# Patient Record
Sex: Female | Born: 1995 | Race: Black or African American | Hispanic: No | Marital: Single | Smoking: Never smoker
Health system: Southern US, Community
[De-identification: ages and names within clinical notes are randomized; demographics above are authoritative.]

## PROBLEM LIST (undated history)

## (undated) DIAGNOSIS — D649 Anemia, unspecified: Secondary | ICD-10-CM

---
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
## 2017-06-10 ENCOUNTER — Emergency Department: Payer: Medicaid Other

## 2017-06-10 ENCOUNTER — Inpatient Hospital Stay
Admission: EM | Admit: 2017-06-10 | Discharge: 2017-06-12 | DRG: 812 | Disposition: A | Discharge status: Home / Self Care | Payer: Medicaid Other | Attending: Obstetrics & Gynecology | Admitting: Obstetrics & Gynecology

## 2017-06-10 DIAGNOSIS — N920 Excessive and frequent menstruation with regular cycle: Secondary | ICD-10-CM

## 2017-06-10 DIAGNOSIS — D62 Acute posthemorrhagic anemia: Principal | ICD-10-CM | POA: Diagnosis present

## 2017-06-10 DIAGNOSIS — D509 Iron deficiency anemia, unspecified: Secondary | ICD-10-CM | POA: Diagnosis present

## 2017-06-10 DIAGNOSIS — D500 Iron deficiency anemia secondary to blood loss (chronic): Secondary | ICD-10-CM

## 2017-06-10 DIAGNOSIS — Z68.41 Body mass index (BMI) pediatric, greater than or equal to 95th percentile for age: Secondary | ICD-10-CM

## 2017-06-10 DIAGNOSIS — D696 Thrombocytopenia, unspecified: Secondary | ICD-10-CM | POA: Diagnosis present

## 2017-06-10 DIAGNOSIS — E669 Obesity, unspecified: Secondary | ICD-10-CM | POA: Diagnosis present

## 2017-06-10 DIAGNOSIS — D649 Anemia, unspecified: Secondary | ICD-10-CM

## 2017-06-10 HISTORY — DX: Anemia, unspecified: D64.9

## 2017-06-10 LAB — URINALYSIS, ROUTINE W REFLEX MICROSCOPIC
BACTERIA UA: NONE SEEN
BILIRUBIN URINE: NEGATIVE
Glucose, UA: NEGATIVE mg/dL
Ketones, ur: 20 mg/dL — AB
LEUKOCYTES UA: NEGATIVE
Nitrite: NEGATIVE
PROTEIN: NEGATIVE mg/dL
SPECIFIC GRAVITY, URINE: 1.017 (ref 1.005–1.030)
pH: 6 (ref 5.0–8.0)

## 2017-06-10 LAB — CBC
HCT: 19.4 % — ABNORMAL LOW (ref 35.0–47.0)
HEMATOCRIT: 14.3 % — AB (ref 35.0–47.0)
HEMATOCRIT: 16.2 % — AB (ref 35.0–47.0)
HEMOGLOBIN: 4.1 g/dL — AB (ref 12.0–16.0)
HEMOGLOBIN: 4.9 g/dL — AB (ref 12.0–16.0)
Hemoglobin: 6.1 g/dL — ABNORMAL LOW (ref 12.0–16.0)
MCH: 16.8 pg — ABNORMAL LOW (ref 26.0–34.0)
MCH: 19.1 pg — ABNORMAL LOW (ref 26.0–34.0)
MCH: 21.2 pg — ABNORMAL LOW (ref 26.0–34.0)
MCHC: 29 g/dL — ABNORMAL LOW (ref 32.0–36.0)
MCHC: 30.1 g/dL — ABNORMAL LOW (ref 32.0–36.0)
MCHC: 31.3 g/dL — ABNORMAL LOW (ref 32.0–36.0)
MCV: 58 fL — ABNORMAL LOW (ref 80.0–100.0)
MCV: 63.6 fL — AB (ref 80.0–100.0)
MCV: 67.7 fL — ABNORMAL LOW (ref 80.0–100.0)
PLATELETS: 42 10*3/uL — AB (ref 150–440)
Platelets: 38 10*3/uL — ABNORMAL LOW (ref 150–440)
Platelets: 36 10*3/uL — ABNORMAL LOW (ref 150–440)
RBC: 2.47 MIL/uL — AB (ref 3.80–5.20)
RBC: 2.55 MIL/uL — ABNORMAL LOW (ref 3.80–5.20)
RBC: 2.87 MIL/uL — AB (ref 3.80–5.20)
RDW: 29.7 % — ABNORMAL HIGH (ref 11.5–14.5)
RDW: 34.3 % — ABNORMAL HIGH (ref 11.5–14.5)
RDW: 34.8 % — ABNORMAL HIGH (ref 11.5–14.5)
WBC: 8.5 10*3/uL (ref 3.6–11.0)
WBC: 8.8 10*3/uL (ref 3.6–11.0)
WBC: 11.5 10*3/uL — AB (ref 3.6–11.0)

## 2017-06-10 LAB — COMPREHENSIVE METABOLIC PANEL
ALBUMIN: 3.1 g/dL — AB (ref 3.5–5.0)
ALK PHOS: 39 U/L (ref 38–126)
ALT: 13 U/L — ABNORMAL LOW (ref 14–54)
AST: 20 U/L (ref 15–41)
Anion gap: 7 (ref 5–15)
BUN: 14 mg/dL (ref 6–20)
CALCIUM: 8.7 mg/dL — AB (ref 8.9–10.3)
CO2: 26 mmol/L (ref 22–32)
Chloride: 106 mmol/L (ref 101–111)
Creatinine, Ser: 0.59 mg/dL (ref 0.44–1.00)
GFR calc Af Amer: >60 mL/min (ref >60)
GFR calc non Af Amer: >60 mL/min (ref >60)
GLUCOSE: 117 mg/dL — AB (ref 65–99)
Potassium: 3.5 mmol/L (ref 3.5–5.1)
Sodium: 139 mmol/L (ref 135–145)
Total Bilirubin: 0.1 mg/dL — ABNORMAL LOW (ref 0.3–1.2)
Total Protein: 7.3 g/dL (ref 6.5–8.1)

## 2017-06-10 LAB — PROTIME-INR
INR: 1.09
Prothrombin Time: 14.1 seconds (ref 11.4–15.2)

## 2017-06-10 LAB — ABO/RH: ABO/RH(D): A POS

## 2017-06-10 LAB — HEMOGLOBIN A1C
HEMOGLOBIN A1C: 5.3 % (ref 4.8–5.6)
Mean Plasma Glucose: 105.41 mg/dL

## 2017-06-10 LAB — TROPONIN I: Troponin I: <0.03 ng/mL (ref <0.03)

## 2017-06-10 LAB — PREPARE RBC (CROSSMATCH)

## 2017-06-10 LAB — POCT PREGNANCY, URINE: PREG TEST UR: NEGATIVE

## 2017-06-10 LAB — APTT: aPTT: 25 seconds (ref 24–36)

## 2017-06-10 LAB — TSH: TSH: 1.442 u[IU]/mL (ref 0.350–4.500)

## 2017-06-10 LAB — FIBRINOGEN: Fibrinogen: 346 mg/dL (ref 210–475)

## 2017-06-10 MED ORDER — FUROSEMIDE 10 MG/ML IJ SOLN: 20.0000 mg | INTRAMUSCULAR | Status: DC | PRN

## 2017-06-10 MED ORDER — SODIUM CHLORIDE 0.9 % IV SOLN
10.0000 mL/h | Freq: Once | INTRAVENOUS | Status: AC
  Administered 2017-06-11: 10 mL/h via INTRAVENOUS

## 2017-06-10 MED ORDER — SODIUM CHLORIDE 0.9 % IV BOLUS (SEPSIS)
1000.0000 mL | Freq: Once | INTRAVENOUS | Status: AC
  Administered 2017-06-10: 1000 mL via INTRAVENOUS

## 2017-06-10 MED ORDER — SODIUM CHLORIDE 0.9 % IV SOLN
Freq: Once | INTRAVENOUS | Status: AC
  Administered 2017-06-10: 19:00:00 via INTRAVENOUS

## 2017-06-10 MED ORDER — PRENATAL MULTIVITAMIN CH
1.0000 | ORAL_TABLET | Freq: Every day | ORAL | Status: DC
  Filled 2017-06-10 (×2): qty 1

## 2017-06-10 NOTE — Progress Notes (Signed)
Received phone call from lab stating patients has a hemoglobin of 4.9.  Second transfusion to take place at this time.

## 2017-06-10 NOTE — ED Notes (Signed)
Date and time results received: 06/10/17 9:52 AM  Test: Hemoglobin and Hemotocrit Critical Value: Hgb 4.1, Hct 14.1  Name of Provider Notified: Dr. Lenard Lance  Orders Received? Or Actions Taken?: Orders Received - See Orders for details

## 2017-06-10 NOTE — ED Notes (Signed)
Apologized for delay to patient and family. Pt continues to be resting in bed. Will continue to monitor. Pt requesting something to drink. Explained will have to speak with MD about being able to eat and drink. Pt states understanding at this time.

## 2017-06-10 NOTE — ED Notes (Signed)
This RN attempted to call report to 3rd floor. Per 3rd floor, pt is not appropriate, will be contacting house supervisor to move patient to more appropriate floor.

## 2017-06-10 NOTE — ED Notes (Signed)
Pt continues to be resting in bed with family at bedside at this time. NAD noted at this time. Will continue to monitor for further patient needs.

## 2017-06-10 NOTE — ED Notes (Signed)
Shannin, RN at bedside to co-sign on blood transfusion.

## 2017-06-10 NOTE — ED Triage Notes (Signed)
Pt with mother who reports pt has been having N/V x 2 days with extreme fatigue. Pt denies pain. States that she was heading to school this am when she fell bc her legs felt weak, pt denies hitting head. Pt appears pale in color. Hx of anemia. Pt states that she had a period that began 4 days ago and has been much heavier than usual.

## 2017-06-10 NOTE — ED Notes (Signed)
Pt taken to Xray.

## 2017-06-10 NOTE — ED Triage Notes (Signed)
Pt c/o N/V for the past 2 days with fatigue.. States "I just feel so weak".Marland Kitchen

## 2017-06-10 NOTE — ED Notes (Signed)
Consent obtained and placed in the chart at this time. Pt up to the bathroom to attempt to give a UA. X-ray waiting to take patient to the bathroom at this time.

## 2017-06-10 NOTE — ED Notes (Addendum)
This RN called to Dr. Elesa Massed to let her know about delay. Per Dr. Elesa Massed, if not too long before pt is transferred then okay to hold other units and let patient get type specific blood on floor. Explained delay to patient at this time.

## 2017-06-10 NOTE — ED Notes (Signed)
Pt up to the bathroom with assistance from mom at this time. NAD noted. Apologized for delay. Will continue to monitor.

## 2017-06-10 NOTE — ED Notes (Signed)
MD notified of patient's in patient's HR from 113 to 100 and BP from 125/59 to 125/49. Upon this RN exiting the room pt is noted to be calm and resting in bed at this time watching TV with family at bedside. NAD noted, pt denies any CP, SHOB, dizziness, pt is alert and oriented, NAD noted at this time.

## 2017-06-10 NOTE — Consult Note (Signed)
Patient is an 21 year old female who reports heavy menses and presents to the emergency room with significant weakness and fatigue. Her hemoglobin was found to be 4.1 with an MCV of 58.0 and a platelet count of 38. Case discussed with emergency room physician.   I suspect this is severe iron deficiency anemia secondary to heavy menses and her significant thrombocytopenia is consumptive. Agree with blood transfusion. Patient may require bone marrow biopsy in the next 1-2 days if no significant improvement of her blood counts. Case was also discussed with pathology with no obvious blasts or other abnormalities other than her severe cytopenias on peripheral smear.  Full consult to follow.

## 2017-06-10 NOTE — Consult Note (Signed)
[place]  [phone] [phone]) [phone]  ID: Kara Bell OB: 31-Jan-1996  MR#: [id number]  GMW#:[id number]  Patient Care Team: Patient, No Pcp Per as PCP - General (General Practice)  CHIEF COMPLAINT: Severe symptomatic anemia, thrombocytopenia.  INTERVAL HISTORY: Patient is an 21 year old female who presented to the ER with several months complain of worsening weakness and fatigue. She also reports significantly menses. Upon evaluation, she is noted to have a hemoglobin of 4.1 and a platelet count of 38. She feels mildly improved after 1 unit of packed red blood cells improving her hemoglobin of 4.9. She has no neurologic complaints. She denies any recent fevers or illnesses. She has a good appetite and denies weight loss. She has no chest pain or shortness of breath. She denies any nausea, vomiting, constipation, or diarrhea. She has no melena or hematochezia. She has no urinary complaints. Patient otherwise feels well and offers no further specific complaints.  REVIEW OF SYSTEMS:   Review of Systems  Constitutional: Positive for malaise/fatigue. Negative for fever and weight loss.  Respiratory: Negative.  Negative for cough, hemoptysis and shortness of breath.   Cardiovascular: Negative.  Negative for chest pain and leg swelling.  Gastrointestinal: Negative.  Negative for abdominal pain, blood in stool and melena.  Genitourinary: Negative.  Negative for hematuria.  Musculoskeletal: Negative.   Skin: Negative.  Negative for rash.  Neurological: Positive for weakness.  Psychiatric/Behavioral: Negative.  The patient is not nervous/anxious.     As per HPI. Otherwise, a complete review of systems is negative.  PAST MEDICAL HISTORY: Past Medical History:  Diagnosis Date  . Anemia     PAST SURGICAL HISTORY: History reviewed. No pertinent surgical history.  FAMILY HISTORY:Reviewed and unchanged. No reported history of malignancy or chronic  disease.  ADVANCED DIRECTIVES (Y/N):  @ADVDIR @  HEALTH MAINTENANCE: Social History  Substance Use Topics  . Smoking status: Never Smoker  . Smokeless tobacco: Never Used  . Alcohol use No     Colonoscopy:  PAP:  Bone density:  Lipid panel:  No Known Allergies  Current Facility-Administered Medications  Medication Dose Route Frequency Provider Last Rate Last Dose  . 0.9 %  sodium chloride infusion  10 mL/hr Intravenous Once Harvest Dark, MD   Stopped at 06/10/17 1835  . furosemide (LASIX) injection 20 mg  20 mg Intravenous PRN Ward, Honor Loh, MD      . prenatal multivitamin tablet 1 tablet  1 tablet Oral Q1200 Ward, Honor Loh, MD        OBJECTIVE: Vitals:   06/10/17 2042 06/10/17 2301  BP: (!) 139/51 (!) 123/44  Pulse: 99 92  Resp: 20 20  Temp: 99 F (37.2 C) 98.9 F (37.2 C)  SpO2: 100% 100%     Body mass index is 41.2 kg/m.    ECOG FS:0 - Asymptomatic  General: Well-developed, well-nourished, no acute distress. Eyes: Pink conjunctiva, anicteric sclera. HEENT: Normocephalic, moist mucous membranes, clear oropharnyx. Lungs: Clear to auscultation bilaterally. Heart: Regular rate and rhythm. No rubs, murmurs, or gallops. Abdomen: Soft, nontender, nondistended. No organomegaly noted, normoactive bowel sounds. Musculoskeletal: No edema, cyanosis, or clubbing. Neuro: Alert, answering all questions appropriately. Cranial nerves grossly intact. Skin: No rashes or petechiae noted. Psych: Normal affect. Lymphatics: No cervical, calvicular, axillary or inguinal LAD.   LAB RESULTS:  Lab Results  Component Value Date   NA 139 06/10/2017   K 3.5 06/10/2017   CL 106 06/10/2017   CO2 26 06/10/2017   GLUCOSE 117 (H) 06/10/2017  BUN 14 06/10/2017   CREATININE 0.59 06/10/2017   CALCIUM 8.7 (L) 06/10/2017   PROT 7.3 06/10/2017   ALBUMIN 3.1 (L) 06/10/2017   AST 20 06/10/2017   ALT 13 (L) 06/10/2017   ALKPHOS 39 06/10/2017   BILITOT 0.1 (L) 06/10/2017    GFRNONAA >60 06/10/2017   GFRAA >60 06/10/2017    Lab Results  Component Value Date   WBC 11.5 (H) 06/10/2017   HGB 6.1 (L) 06/10/2017   HCT 19.4 (L) 06/10/2017   MCV 67.7 (L) 06/10/2017   PLT 42 (L) 06/10/2017     STUDIES: Dg Chest 2 View  Result Date: 06/10/2017 CLINICAL DATA:  Anemia. Dizziness x 2 days. Pt stated she fainted today and was told she has a low blood count today. Never smoker No sx EXAM: CHEST  2 VIEW COMPARISON:  None. FINDINGS: Heart size and mediastinal contours are within normal limits. Lungs are clear. Lung volumes are normal. No pleural effusion or pneumothorax. Mild dextroscoliosis of the mid/upper thoracic spine. No acute or suspicious osseous finding. IMPRESSION: No active cardiopulmonary disease. No evidence of pneumonia or pulmonary edema. Mild scoliosis of the thoracic spine, measuring approximately 6 degrees. Electronically Signed   By: Franki Cabot M.D.   On: 06/10/2017 11:45    ASSESSMENT: Severe symptomatic anemia, thrombocytopenia.  PLAN:    1. Symptomatic anemia: Likely secondary to heavy menses and severe iron deficiency. Patient's hemoglobin is improving with several units of packed red blood cells. Case was discussed with pathology with no obvious blasts or other abnormalities other than her severe cytopenias on peripheral smear. Will get iron stores, B-12, and folate for completeness. Given her significantly decreased MCV, will also order hemoglobinopathy profile. If patient's anemia does not improve in the next several days, will consider bone marrow biopsy for further evaluation. Continue transfusions until hemoglobin is approximately 8.0. 2. Thrombocytopenia: Likely consumptive. Will consider bone marrow biopsy in the future if necessary. 3. Heavy menses: Continue monitoring and treatment for OB/GYN.  Appreciate consult, will follow.   Lloyd Huger, MD   06/10/2017 11:16 PM

## 2017-06-10 NOTE — ED Provider Notes (Signed)
[facility] Emergency Department Provider Note  Time seen: 9:08 AM  I have reviewed the triage vital signs and the nursing notes.   HISTORY  Chief Complaint Fatigue and Emesis    HPI Kara Bell is a 21 y.o. female with a past medical history of anemia who presents to the emergency department for generalized weakness. According to the patient for the past 4 days she has been feeling very weak and fatigued. States she was feeling nauseated yesterday with one episode of vomiting. States today her legs gave out because she was feeling so weak but denies hitting her head or passing out. Patient states for the past 4 days she has been on her menstrual cycle. Currently denies any nausea vomiting or diarrhea. Denies any dysuria. Denies any recent fever. Denies any chest pain or abdominal pain.  Past Medical History:  Diagnosis Date  . Anemia     There are no active problems to display for this patient.   History reviewed. No pertinent surgical history.  Prior to Admission medications   Not on File    No Known Allergies  No family history on file.  Social History Social History  Substance Use Topics  . Smoking status: Never Smoker  . Smokeless tobacco: Never Used  . Alcohol use No    Review of Systems Constitutional: Negative for fever.Positive for generalized weakness. Eyes: Negative for visual changes. ENT: Negative for congestion Cardiovascular: Negative for chest pain. Respiratory: Negative for shortness of breath. Gastrointestinal: Negative for abdominal pain. Nausea and vomiting yesterday, now resolved. Negative for diarrhea. Genitourinary: Negative for dysuria. Musculoskeletal: Negative for back pain Neurological: Negative for headache All other ROS negative  ____________________________________________   PHYSICAL EXAM:  VITAL SIGNS: ED Triage Vitals [06/10/17 0838]  Enc Vitals Group     BP (!) 113/49     Pulse Rate (!) 114   Resp 20     Temp 98.8 F (37.1 C)     Temp Source Oral     SpO2 100 %     Weight      Height      Head Circumference      Peak Flow      Pain Score      Pain Loc      Pain Edu?      Excl. in [place]?     Constitutional: Alert and oriented. Well appearing and in no distress. Eyes: Normal exam ENT   Head: Normocephalic and atraumatic.   Mouth/Throat: Mucous membranes are moist. Cardiovascular: Regular rhythm, rate around 110 bpm. No obvious murmur. Respiratory: Normal respiratory effort without tachypnea nor retractions. Breath sounds are clear  Gastrointestinal: Soft and nontender. No distention.  Musculoskeletal: Nontender with normal range of motion in all extremities.  Neurologic:  Normal speech and language. No gross focal neurologic deficits  Skin:  Skin is warm, dry and intact.  Psychiatric: Mood and affect are normal.  ____________________________________________    EKG  EKG reviewed and interpreted by myself shows sinus tachycardia 107 bpm, narrow QRS, normal axis, normal intervals, nonspecific changes. No ST elevation.  ____________________________________________   INITIAL IMPRESSION / ASSESSMENT AND PLAN / ED COURSE  Pertinent labs & imaging results that were available during my care of the patient were reviewed by me and considered in my medical decision making (see chart for details).  Patient presents the emergency department for generalized fatigue/weakness as well as vomiting yesterday. Patient does have a history of anemia, but denies any history of blood  transfusion in the past. Patient denies any dysuria, states she was nauseated with one episode of vomiting yesterday but denies any symptoms. Overall patient appears very well, nontender abdomen, normal exam besides mild tachycardia. We will check labs, IV hydrate and obtain an EKG. Patient agreeable plan.  Asians hemoglobin has resulted extremely low at 4.1. In patient's platelet count is also extremely  low at 38. I discussed the patient with medicine who recommends OB/GYN admission for menorrhagia. Given the low platelet count discussed the patient with Dr. Grayland Ormond of hematology who will consult for likely bone marrow biopsy. We will admit to Dr. Leonides Schanz of OB/GYN for transfusion with hematology consultation.  CRITICAL CARE Performed by: Harvest Dark   Total critical care time: 30 minutes  Critical care time was exclusive of separately billable procedures and treating other patients.  Critical care was necessary to treat or prevent imminent or life-threatening deterioration.  Critical care was time spent personally by me on the following activities: development of treatment plan with patient and/or surrogate as well as nursing, discussions with consultants, evaluation of patient's response to treatment, examination of patient, obtaining history from patient or surrogate, ordering and performing treatments and interventions, ordering and review of laboratory studies, ordering and review of radiographic studies, pulse oximetry and re-evaluation of patient's condition.   ____________________________________________   FINAL CLINICAL IMPRESSION(S) / ED DIAGNOSES  Weakness symptomatic anemia   Harvest Dark, MD 06/10/17 1121

## 2017-06-10 NOTE — ED Notes (Addendum)
MD to bedside at this time, explaining critical results and recollection of samples as requested by lab.

## 2017-06-10 NOTE — ED Notes (Signed)
Pt returned from X-ray.  

## 2017-06-10 NOTE — H&P (Signed)
Consult History and Physical   SERVICE: Gynecology   Patient Name: Kara Bell Patient MRN:   [id number]  CC: extreme fatigue  HPI: Kara Bell is a 21 y.o. G0 who presented with severe fatigue, to the point of falling, x 3 days worsening today.  She has felt tired in the past but not like this.  She is on day 4 of her menses, and always has heavy periods, although today is much lighter.  1 pad so far today.   She usually has very heavy periods that last about 2 weeks at times, irregular but does not keep record.  She was told in the past she has iron deficiency anemia, but no other workup or period prevention/manipulation had been done.  Mom and brother in room, and both explain that for months they have been telling the patient something seems wrong.  MHx:She is obese but otherwise has no medical history. SHx: No surgical history  Social Hx: Lives at home with family, no drugs/alcohol Gyn Hx:Not sexually active, no cervical procedures, no gyn exams OB Hx: No pregnancies FH: sisters have irregular periods as well.   Meds: none Allergies: none   Review of Systems: positives in bold GEN:   fevers, chills, weight changes, appetite changes, fatigue, night sweats HEENT:  HA, vision changes, hearing loss, congestion, rhinorrhea, sinus pressure, dysphagia CV:   CP, palpitations PULM:  SOB, cough GI:  abd pain, N/V/D/C GU:  dysuria, urgency, frequency MSK:  arthralgias, myalgias, back pain, swelling SKIN:  rashes, color changes, pallor NEURO:  numbness, weakness, tingling, seizures, dizziness, tremors PSYCH:  depression, anxiety, behavioral problems, confusion  HEME/LYMPH:  easy bruising or bleeding ENDO:  heat/cold intolerance  Past Obstetrical History: OB History    No data available      Past Gynecologic History: Patient's last menstrual period was 06/06/2017 (approximate).   Past Medical History: Past Medical History:  Diagnosis Date  . Anemia     Social History:   Social History   Social History  . Marital status: Single    Spouse name: N/A  . Number of children: N/A  . Years of education: N/A   Occupational History  . Not on file.   Social History Main Topics  . Smoking status: Never Smoker  . Smokeless tobacco: Never Used  . Alcohol use No  . Drug use: No  . Sexual activity: Not on file   Other Topics Concern  . Not on file   Social History Narrative  . No narrative on file    Home Medications:  Medications reconciled in EPIC  No current facility-administered medications on file prior to encounter.    No current outpatient prescriptions on file prior to encounter.    Allergies:  No Known Allergies  Physical Exam:  Temp:  [98.6 F (37 C)-98.8 F (37.1 C)] 98.8 F (37.1 C) (08/27 1224) Pulse Rate:  [98-114] 98 (08/27 1224) Resp:  [20] 20 (08/27 1224) BP: (113-125)/(49-59) 125/49 (08/27 1224) SpO2:  [100 %] 100 % (08/27 1224)   General Appearance:  Well developed, well nourished, no acute distress, alert and oriented, cooperative and appears stated age HEENT:  Normocephalic atraumatic, extraocular movements intact, moist mucous membranes, neck supple with midline trachea and thyroid without masses.  + pallor of mucous membranes and tongue. Cardiovascular:  Normal S1/S2, tachycardic with regular rhythm, no murmurs, 2+ distal pulses Pulmonary:  clear to auscultation, no wheezes, rales or rhonchi, symmetric air entry, good air exchange Abdomen:  Bowel sounds present, soft, nontender,  nondistended, no abnormal masses or organomegaly, no epigastric pain Back: inspection of back is normal Extremities:  extremities normal, no tenderness, atraumatic, no cyanosis or edema Skin:  normal coloration and turgor, no rashes, no suspicious skin lesions noted  Neurologic:  Cranial nerves 2-12 grossly intact, grossly equal strength and muscle tone, normal speech, no focal findings or movement disorder noted. Psychiatric:  Normal mood and  affect, appropriate, no AH/VH Pelvic: deferred  Labs/Studies:   Results for orders placed or performed during the hospital encounter of 06/10/17 (from the past 24 hour(s))  CBC     Status: Abnormal   Collection Time: 06/10/17  9:21 AM  Result Value Ref Range   WBC 8.5 3.6 - 11.0 K/uL   RBC 2.47 (L) 3.80 - 5.20 MIL/uL   Hemoglobin 4.1 (LL) 12.0 - 16.0 g/dL   HCT 14.3 (LL) 35.0 - 47.0 %   MCV 58.0 (L) 80.0 - 100.0 fL   MCH 16.8 (L) 26.0 - 34.0 pg   MCHC 29.0 (L) 32.0 - 36.0 g/dL   RDW 29.7 (H) 11.5 - 14.5 %   Platelets 38 (L) 150 - 440 K/uL  Comprehensive metabolic panel     Status: Abnormal   Collection Time: 06/10/17  9:21 AM  Result Value Ref Range   Sodium 139 135 - 145 mmol/L   Potassium 3.5 3.5 - 5.1 mmol/L   Chloride 106 101 - 111 mmol/L   CO2 26 22 - 32 mmol/L   Glucose, Bld 117 (H) 65 - 99 mg/dL   BUN 14 6 - 20 mg/dL   Creatinine, Ser 0.59 0.44 - 1.00 mg/dL   Calcium 8.7 (L) 8.9 - 10.3 mg/dL   Total Protein 7.3 6.5 - 8.1 g/dL   Albumin 3.1 (L) 3.5 - 5.0 g/dL   AST 20 15 - 41 U/L   ALT 13 (L) 14 - 54 U/L   Alkaline Phosphatase 39 38 - 126 U/L   Total Bilirubin 0.1 (L) 0.3 - 1.2 mg/dL   GFR calc non Af Amer >60 >60 mL/min   GFR calc Af Amer >60 >60 mL/min   Anion gap 7 5 - 15  Troponin I     Status: None   Collection Time: 06/10/17  9:21 AM  Result Value Ref Range   Troponin I <0.03 <0.03 ng/mL  ABO/Rh     Status: None   Collection Time: 06/10/17  9:21 AM  Result Value Ref Range   ABO/RH(D) A POS   Type and screen [facility]     Status: None (Preliminary result)   Collection Time: 06/10/17  9:48 AM  Result Value Ref Range   ABO/RH(D) A POS    Antibody Screen NEG    Sample Expiration 06/13/2017    Unit Number [id number]    Blood Component Type RED CELLS,LR    Unit division 00    Status of Unit ALLOCATED    Transfusion Status OK TO TRANSFUSE    Crossmatch Result Compatible    Unit Number [id number]    Blood Component Type RED  CELLS,LR    Unit division 00    Status of Unit ISSUED    Transfusion Status OK TO TRANSFUSE    Crossmatch Result Compatible   Prepare RBC     Status: None   Collection Time: 06/10/17 11:00 AM  Result Value Ref Range   Order Confirmation ORDER PROCESSED BY BLOOD BANK      TVUS: ordered  Other Imaging: Dg Chest 2 View  Result  Date: 06/10/2017 CLINICAL DATA:  Anemia. Dizziness x 2 days. Pt stated she fainted today and was told she has a low blood count today. Never smoker No sx EXAM: CHEST  2 VIEW COMPARISON:  None. FINDINGS: Heart size and mediastinal contours are within normal limits. Lungs are clear. Lung volumes are normal. No pleural effusion or pneumothorax. Mild dextroscoliosis of the mid/upper thoracic spine. No acute or suspicious osseous finding. IMPRESSION: No active cardiopulmonary disease. No evidence of pneumonia or pulmonary edema. Mild scoliosis of the thoracic spine, measuring approximately 6 degrees. Electronically Signed   By: Franki Cabot M.D.   On: 06/10/2017 11:45     Assessment / Plan:   Kara Bell is a 21 y.o. with profound anemia and thrombocytopenia.    1. Could be due to excessive blood loss chronically, with this heavy bleed over 4 days the tipping point.  Due to her ability to get through her days, my assumption is that her anemia has been developing over time and not acutely.  The question is, is this related to chronic blood loss or is there something more ominous going on.  2. Workup for hematologic disorder -  Consult heme  Bone marrow biopsy 3. Replete blood   3u PRBC  2u platelets (if she qualifies - right now she only qualifies for 1unit)  Admit to 3rd floor, inpatient.     Thank you for the opportunity to be involved with this patient's care.  ----- Larey Days, MD Attending Obstetrician and Gynecologist [facility], Department of [facility]

## 2017-06-11 ENCOUNTER — Inpatient Hospital Stay: Payer: Medicaid Other

## 2017-06-11 LAB — CBC
HCT: 20.5 % — ABNORMAL LOW (ref 35.0–47.0)
HCT: 27.3 % — ABNORMAL LOW (ref 35.0–47.0)
HCT: 29 % — ABNORMAL LOW (ref 35.0–47.0)
HEMOGLOBIN: 9 g/dL — AB (ref 12.0–16.0)
HEMOGLOBIN: 9.4 g/dL — AB (ref 12.0–16.0)
Hemoglobin: 6.7 g/dL — ABNORMAL LOW (ref 12.0–16.0)
MCH: 23.3 pg — ABNORMAL LOW (ref 26.0–34.0)
MCH: 24.7 pg — ABNORMAL LOW (ref 26.0–34.0)
MCH: 24.6 pg — AB (ref 26.0–34.0)
MCHC: 32.7 g/dL (ref 32.0–36.0)
MCHC: 33 g/dL (ref 32.0–36.0)
MCHC: 32.5 g/dL (ref 32.0–36.0)
MCV: 71.3 fL — ABNORMAL LOW (ref 80.0–100.0)
MCV: 74.9 fL — ABNORMAL LOW (ref 80.0–100.0)
MCV: 75.7 fL — ABNORMAL LOW (ref 80.0–100.0)
PLATELETS: 39 10*3/uL — AB (ref 150–440)
PLATELETS: 120 10*3/uL — AB (ref 150–440)
PLATELETS: 72 10*3/uL — AB (ref 150–440)
RBC: 2.88 MIL/uL — AB (ref 3.80–5.20)
RBC: 3.65 MIL/uL — AB (ref 3.80–5.20)
RBC: 3.83 MIL/uL (ref 3.80–5.20)
RDW: 32.5 % — ABNORMAL HIGH (ref 11.5–14.5)
RDW: 29.2 % — ABNORMAL HIGH (ref 11.5–14.5)
RDW: 29.5 % — AB (ref 11.5–14.5)
WBC: 9.7 10*3/uL (ref 3.6–11.0)
WBC: 13.3 10*3/uL — ABNORMAL HIGH (ref 3.6–11.0)
WBC: 13.2 10*3/uL — ABNORMAL HIGH (ref 3.6–11.0)

## 2017-06-11 LAB — PLATELET FUNCTION ASSAY: Collagen / Epinephrine: 174 seconds (ref 0–193)

## 2017-06-11 LAB — PREPARE RBC (CROSSMATCH)

## 2017-06-11 LAB — BPAM PLATELET PHERESIS
BLOOD PRODUCT EXPIRATION DATE: 201808292359
ISSUE DATE / TIME: 201808272304
Unit Type and Rh: 6200

## 2017-06-11 LAB — IRON AND TIBC
Iron: 211 ug/dL — ABNORMAL HIGH (ref 28–170)
Saturation Ratios: 45 % — ABNORMAL HIGH (ref 10.4–31.8)
TIBC: 468 ug/dL — ABNORMAL HIGH (ref 250–450)
UIBC: 257 ug/dL

## 2017-06-11 LAB — DIFFERENTIAL
BASOS ABS: 0.2 10*3/uL — AB (ref 0–0.1)
BASOS PCT: 2 %
EOS ABS: 0.1 10*3/uL (ref 0–0.7)
Eosinophils Relative: 1 %
Lymphocytes Relative: 27 %
Lymphs Abs: 2.7 10*3/uL (ref 1.0–3.6)
MONOS PCT: 7 %
Monocytes Absolute: 0.7 10*3/uL (ref 0.2–0.9)
NEUTROS PCT: 63 %
Neutro Abs: 6.2 10*3/uL (ref 1.4–6.5)

## 2017-06-11 LAB — FERRITIN: FERRITIN: 5 ng/mL — AB (ref 11–307)

## 2017-06-11 LAB — VITAMIN B12: VITAMIN B 12: 478 pg/mL (ref 180–914)

## 2017-06-11 LAB — PREPARE PLATELET PHERESIS: UNIT DIVISION: 0

## 2017-06-11 LAB — FOLATE: Folate: 15 ng/mL (ref >5.9)

## 2017-06-11 MED ORDER — SODIUM CHLORIDE 0.9 % IV SOLN
Freq: Once | INTRAVENOUS | Status: AC
  Administered 2017-06-12: 01:00:00 via INTRAVENOUS

## 2017-06-11 MED ORDER — DIPHENHYDRAMINE HCL 25 MG PO CAPS
25.0000 mg | ORAL_CAPSULE | Freq: Four times a day (QID) | ORAL | Status: DC | PRN
  Administered 2017-06-11: 25 mg via ORAL
  Filled 2017-06-11: qty 1

## 2017-06-11 MED ORDER — SODIUM CHLORIDE 0.9 % IV SOLN: Freq: Once | INTRAVENOUS | Status: DC

## 2017-06-11 MED ORDER — SODIUM CHLORIDE 0.9 % IV SOLN
Freq: Once | INTRAVENOUS | Status: AC
  Administered 2017-06-11: 11:00:00 via INTRAVENOUS

## 2017-06-11 MED ORDER — DIPHENHYDRAMINE HCL 25 MG PO CAPS
25.0000 mg | ORAL_CAPSULE | Freq: Once | ORAL | Status: AC
  Administered 2017-06-12: 25 mg via ORAL
  Filled 2017-06-11: qty 1

## 2017-06-11 MED ORDER — ACETAMINOPHEN 325 MG PO TABS
650.0000 mg | ORAL_TABLET | Freq: Once | ORAL | Status: AC
  Administered 2017-06-12: 650 mg via ORAL
  Filled 2017-06-11: qty 2

## 2017-06-11 NOTE — Progress Notes (Addendum)
Obstetric and Gynecology  HD 2  Subjective  Patient doing well, no complaints, tolerating PO intake, ambulating without difficulty, voiding spontaneously.     Denies CP, SOB, F/C, N/V/D, or leg pain.  Feels "great!" today, no VB.  8/27: admitted to floor from ED with profound anemia and thrombocytopenia.  Received 3u PRBC and 1u platelets.  S/p consult by hematology.   Objective  Objective:   Vitals:   06/11/17 0046 06/11/17 0203 06/11/17 0236 06/11/17 0407  BP: (!) 121/40 (!) 121/44 (!) 116/45 (!) 117/51  Pulse: (!) 107 92 88 88  Resp: 16 (!) 24 20 20  Temp: 99.2 F (37.3 C) 99.2 F (37.3 C) 99 F (37.2 C) 98.8 F (37.1 C)  TempSrc: Oral Oral Oral Oral  SpO2: 100% 100% 100% 100%  Weight:      Height:        General: NAD, mucous membranes pale Cardiovascular: RRR, no murmurs Pulmonary: CTAB, normal respiratory effort Abdomen: Benign. Non-tender, +BS, no guarding. Extremities: No erythema or cords, no calf tenderness, with normal peripheral pulses.   Labs: Results for orders placed or performed during the hospital encounter of 06/10/17 (from the past 24 hour(s))  CBC     Status: Abnormal   Collection Time: 06/10/17  9:21 AM  Result Value Ref Range   WBC 8.5 3.6 - 11.0 K/uL   RBC 2.47 (L) 3.80 - 5.20 MIL/uL   Hemoglobin 4.1 (LL) 12.0 - 16.0 g/dL   HCT 14.3 (LL) 35.0 - 47.0 %   MCV 58.0 (L) 80.0 - 100.0 fL   MCH 16.8 (L) 26.0 - 34.0 pg   MCHC 29.0 (L) 32.0 - 36.0 g/dL   RDW 29.7 (H) 11.5 - 14.5 %   Platelets 38 (L) 150 - 440 K/uL  Comprehensive metabolic panel     Status: Abnormal   Collection Time: 06/10/17  9:21 AM  Result Value Ref Range   Sodium 139 135 - 145 mmol/L   Potassium 3.5 3.5 - 5.1 mmol/L   Chloride 106 101 - 111 mmol/L   CO2 26 22 - 32 mmol/L   Glucose, Bld 117 (H) 65 - 99 mg/dL   BUN 14 6 - 20 mg/dL   Creatinine, Ser 0.59 0.44 - 1.00 mg/dL   Calcium 8.7 (L) 8.9 - 10.3 mg/dL   Total Protein 7.3 6.5 - 8.1 g/dL   Albumin 3.1 (L) 3.5 - 5.0  g/dL   AST 20 15 - 41 U/L   ALT 13 (L) 14 - 54 U/L   Alkaline Phosphatase 39 38 - 126 U/L   Total Bilirubin 0.1 (L) 0.3 - 1.2 mg/dL   GFR calc non Af Amer >60 >60 mL/min   GFR calc Af Amer >60 >60 mL/min   Anion gap 7 5 - 15  Troponin I     Status: None   Collection Time: 06/10/17  9:21 AM  Result Value Ref Range   Troponin I <0.03 <0.03 ng/mL  Urinalysis, Routine w reflex microscopic     Status: Abnormal   Collection Time: 06/10/17  9:21 AM  Result Value Ref Range   Color, Urine YELLOW (A) YELLOW   APPearance CLEAR (A) CLEAR   Specific Gravity, Urine 1.017 1.005 - 1.030   pH 6.0 5.0 - 8.0   Glucose, UA NEGATIVE NEGATIVE mg/dL   Hgb urine dipstick MODERATE (A) NEGATIVE   Bilirubin Urine NEGATIVE NEGATIVE   Ketones, ur 20 (A) NEGATIVE mg/dL   Protein, ur NEGATIVE NEGATIVE mg/dL   Nitrite NEGATIVE   NEGATIVE   Leukocytes, UA NEGATIVE NEGATIVE   RBC / HPF TOO NUMEROUS TO COUNT 0 - 5 RBC/hpf   WBC, UA 0-5 0 - 5 WBC/hpf   Bacteria, UA NONE SEEN NONE SEEN   Squamous Epithelial / LPF 0-5 (A) NONE SEEN   Mucus PRESENT   ABO/Rh     Status: None   Collection Time: 06/10/17  9:21 AM  Result Value Ref Range   ABO/RH(D) A POS   Type and screen [facility]     Status: None (Preliminary result)   Collection Time: 06/10/17  9:48 AM  Result Value Ref Range   ABO/RH(D) A POS    Antibody Screen NEG    Sample Expiration 06/13/2017    Unit Number [id number]    Blood Component Type RED CELLS,LR    Unit division 00    Status of Unit REL FROM ALLOC    Transfusion Status OK TO TRANSFUSE    Crossmatch Result Compatible    Unit Number [id number]    Blood Component Type RED CELLS,LR    Unit division 00    Status of Unit ISSUED    Transfusion Status OK TO TRANSFUSE    Crossmatch Result Compatible    Unit Number [id number]    Blood Component Type RED CELLS,LR    Unit division 00    Status of Unit ISSUED    Transfusion Status OK TO TRANSFUSE    Crossmatch Result  Compatible    Unit Number [id number]    Blood Component Type RED CELLS,LR    Unit division 00    Status of Unit ISSUED    Transfusion Status OK TO TRANSFUSE    Crossmatch Result Compatible   Prepare RBC     Status: None   Collection Time: 06/10/17 11:00 AM  Result Value Ref Range   Order Confirmation ORDER PROCESSED BY BLOOD BANK   Prepare RBC     Status: None   Collection Time: 06/10/17  2:00 PM  Result Value Ref Range   Order Confirmation ORDER PROCESSED BY BLOOD BANK   Prepare Pheresed Platelets     Status: None (Preliminary result)   Collection Time: 06/10/17  2:00 PM  Result Value Ref Range   Unit Number [id number]    Blood Component Type PLTP LR2 PAS    Unit division 00    Status of Unit ISSUED    Transfusion Status OK TO TRANSFUSE   Pregnancy, urine POC     Status: None   Collection Time: 06/10/17  3:34 PM  Result Value Ref Range   Preg Test, Ur NEGATIVE NEGATIVE  TSH     Status: None   Collection Time: 06/10/17  4:15 PM  Result Value Ref Range   TSH 1.442 0.350 - 4.500 uIU/mL  Hemoglobin A1c     Status: None   Collection Time: 06/10/17  4:15 PM  Result Value Ref Range   Hgb A1c MFr Bld 5.3 4.8 - 5.6 %   Mean Plasma Glucose 105.41 mg/dL  Protime-INR     Status: None   Collection Time: 06/10/17  4:15 PM  Result Value Ref Range   Prothrombin Time 14.1 11.4 - 15.2 seconds   INR 1.09   APTT     Status: None   Collection Time: 06/10/17  4:15 PM  Result Value Ref Range   aPTT 25 24 - 36 seconds  Fibrinogen     Status: None   Collection Time: 06/10/17  4:15 PM  Result Value Ref   Range   Fibrinogen 346 210 - 475 mg/dL  CBC     Status: Abnormal   Collection Time: 06/10/17  4:15 PM  Result Value Ref Range   WBC 8.8 3.6 - 11.0 K/uL   RBC 2.55 (L) 3.80 - 5.20 MIL/uL   Hemoglobin 4.9 (LL) 12.0 - 16.0 g/dL   HCT 16.2 (L) 35.0 - 47.0 %   MCV 63.6 (L) 80.0 - 100.0 fL   MCH 19.1 (L) 26.0 - 34.0 pg   MCHC 30.1 (L) 32.0 - 36.0 g/dL   RDW 34.3 (H) 11.5 - 14.5 %    Platelets 36 (L) 150 - 440 K/uL  CBC     Status: Abnormal   Collection Time: 06/10/17  9:48 PM  Result Value Ref Range   WBC 11.5 (H) 3.6 - 11.0 K/uL   RBC 2.87 (L) 3.80 - 5.20 MIL/uL   Hemoglobin 6.1 (L) 12.0 - 16.0 g/dL   HCT 19.4 (L) 35.0 - 47.0 %   MCV 67.7 (L) 80.0 - 100.0 fL   MCH 21.2 (L) 26.0 - 34.0 pg   MCHC 31.3 (L) 32.0 - 36.0 g/dL   RDW 34.8 (H) 11.5 - 14.5 %   Platelets 42 (L) 150 - 440 K/uL  Ferritin     Status: Abnormal   Collection Time: 06/11/17  5:02 AM  Result Value Ref Range   Ferritin 5 (L) 11 - 307 ng/mL  Iron and TIBC     Status: Abnormal   Collection Time: 06/11/17  5:02 AM  Result Value Ref Range   Iron 211 (H) 28 - 170 ug/dL   TIBC 468 (H) 250 - 450 ug/dL   Saturation Ratios 45 (H) 10.4 - 31.8 %   UIBC 257 ug/dL  Folate     Status: None   Collection Time: 06/11/17  5:02 AM  Result Value Ref Range   Folate 15.0 >5.9 ng/mL  CBC     Status: Abnormal   Collection Time: 06/11/17  5:02 AM  Result Value Ref Range   WBC 9.7 3.6 - 11.0 K/uL   RBC 2.88 (L) 3.80 - 5.20 MIL/uL   Hemoglobin 6.7 (L) 12.0 - 16.0 g/dL   HCT 20.5 (L) 35.0 - 47.0 %   MCV 71.3 (L) 80.0 - 100.0 fL   MCH 23.3 (L) 26.0 - 34.0 pg   MCHC 32.7 32.0 - 36.0 g/dL   RDW 32.5 (H) 11.5 - 14.5 %   Platelets 39 (L) 150 - 440 K/uL  Prepare platelet pheresis     Status: None (Preliminary result)   Collection Time: 06/11/17  5:30 AM  Result Value Ref Range   Unit Number [id number]    Blood Component Type PLTP LR2 PAS    Unit division 00    Status of Unit ALLOCATED    Transfusion Status OK TO TRANSFUSE     Cultures: Results for orders placed or performed during the hospital encounter of 09/09/16  Culture, group A strep     Status: None   Collection Time: 09/09/16 10:56 PM  Result Value Ref Range Status   Specimen Description THROAT  Final   Special Requests NONE  Final   Culture   Final    NO GROUP A STREP (S.PYOGENES) ISOLATED Performed at [facility]    Report Status  09/12/2016 FINAL  Final    Imaging: Dg Chest 2 View  Result Date: 06/10/2017 CLINICAL DATA:  Anemia. Dizziness x 2 days. Pt stated she fainted today and was told she   has a low blood count today. Never smoker No sx EXAM: CHEST  2 VIEW COMPARISON:  None. FINDINGS: Heart size and mediastinal contours are within normal limits. Lungs are clear. Lung volumes are normal. No pleural effusion or pneumothorax. Mild dextroscoliosis of the mid/upper thoracic spine. No acute or suspicious osseous finding. IMPRESSION: No active cardiopulmonary disease. No evidence of pneumonia or pulmonary edema. Mild scoliosis of the thoracic spine, measuring approximately 6 degrees. Electronically Signed   By: Stan  Maynard M.D.   On: 06/10/2017 11:45     Assessment   20 y.o. Hospital Day: 2 with profound anemia, thrombocytopenia.  Plan   1. Anemia:  S/p 3u PRBC with improvement from 4.1 --> 6.7, 16.2--> 20.5.  Transfuse 2 more units then recheck. 2. Thrombocytopenia:  Minimal improvement after 1u platelets: 38--> 39. Transfuse 1 more unit and more pending results.  Dr. Finnegan from Hematology saw patient in consult and ordered B-vitamin, iron and hemoglobin studies - some still pending.  Iron elevated, TIBC elevated, Ferritin low. Folate normal. Peripheral smear no blasts.  Considering bone marrow biopsy.  3. Heavy menses:  Hormonal and non-hormonal manipulation may be necessary for her for the remainder of her reproductively able time, to decrease the severity of her menses and blood loss.  Will discuss this with her and f/u as outpatient once this acute incident is resolved, and etiology is determined.  I wonder if her periods are so heavy because her platelets are so low?    4. Continue inpatient management.   ----- Chelsea Ward, MD Attending Obstetrician and Gynecologist [facility], Department of OB/GYN [facility]     

## 2017-06-11 NOTE — Progress Notes (Signed)
Pt complains of a whelp looking rash on the side of both cheeks.  No c/o of itching.  No other rash noted on body.  RBC and Platelet transfusion stopped at approximately 1700.  No reaction occurred during transfusion.   Dr. Elesa Massed notified and orders were received for Benadryl.  Will administer Benadryl, continue to monitor patient and notify MD of any further changes.

## 2017-06-11 NOTE — Progress Notes (Signed)
Patient s/p transfusion of 2u PRBC and 1u platelets.  Due to miscommunication and loss of peripheral IV, the transfusion was delayed.  CBC collected 3 hours prior to scheduled time.  Will recheck at 8pm.    ----- Ranae Plumber, MD Attending Obstetrician and Gynecologist [facility] [address], Department of OB/GYN [facility]

## 2017-06-11 NOTE — Progress Notes (Signed)
Ultrasound back, shows endometrial thickening to 9mm per report.  Per my measurement is 54mm.  Limited visibility due to transabdominal approach. Ordinarily I would recommend D&C, Hysteroscopy to resolve lining, however likelihood of hemorrhage with thrombocytopenia is high, thus will defer until this has sufficiently resolved.   ----- Ranae Plumber, MD Attending Obstetrician and Gynecologist [facility] [place], Department of OB/GYN [facility]

## 2017-06-12 LAB — PREPARE PLATELET PHERESIS: UNIT DIVISION: 0

## 2017-06-12 LAB — BPAM PLATELET PHERESIS
Blood Product Expiration Date: 201808302359
ISSUE DATE / TIME: 201808281457
Unit Type and Rh: 6200

## 2017-06-12 LAB — HEMOGLOBINOPATHY EVALUATION
HGB A2 QUANT: 1.7 % — AB (ref 1.8–3.2)
HGB A: 98.3 % (ref 96.4–98.8)
HGB F QUANT: 0 % (ref 0.0–2.0)
Hgb C: 0 %
Hgb S Quant: 0 %
Hgb Variant: 0 %

## 2017-06-12 LAB — CBC
HCT: 29.2 % — ABNORMAL LOW (ref 35.0–47.0)
Hemoglobin: 9.6 g/dL — ABNORMAL LOW (ref 12.0–16.0)
MCH: 24.9 pg — ABNORMAL LOW (ref 26.0–34.0)
MCHC: 32.9 g/dL (ref 32.0–36.0)
MCV: 75.9 fL — ABNORMAL LOW (ref 80.0–100.0)
Platelets: 94 10*3/uL — ABNORMAL LOW (ref 150–440)
RBC: 3.85 MIL/uL (ref 3.80–5.20)
RDW: 28.9 % — ABNORMAL HIGH (ref 11.5–14.5)
WBC: 12.3 10*3/uL — ABNORMAL HIGH (ref 3.6–11.0)

## 2017-06-12 LAB — RAPID HIV SCREEN (HIV 1/2 AB+AG)
HIV 1/2 Antibodies: NONREACTIVE
HIV-1 P24 Antigen - HIV24: NONREACTIVE

## 2017-06-12 NOTE — Progress Notes (Signed)
Patient's hemoglobin and platelet count continue to improve with transfusions. She does not require additional transfusion today. We will hold off on doing bone marrow biopsy at this time, but it be necessary in the future if her counts decline post discharge. Please insure patient has follow-up in the [facility] 1-2 weeks after discharge for laboratory check and further evaluation. Okay to discharge from hematology standpoint, but will defer to OB/GYN since there is a possibility of D&C.  Appreciate consult, call with questions.

## 2017-06-12 NOTE — Discharge Summary (Signed)
Gynecology Physician Postoperative Discharge Summary  Patient ID: Nadyne CoombesJena Eblen MRN: [id number] DOB/AGE: [birth year] 21 y.o.  Admit Date: 06/10/2017 Discharge Date: 06/12/2017  Admissinon Diagnoses: Anemia, thrombocytopenia, heavy menses (acute on chronic blood loss) Discharge Dx: same Procedures:  Transfusion of 5u PRBC, 3uPlatelets, pelvic ultrasound  CBC Latest Ref Rng & Units 06/12/2017 06/11/2017 06/11/2017  WBC 3.6 - 11.0 K/uL 12.3(H) 13.2(H) 13.3(H)  Hemoglobin 12.0 - 16.0 g/dL 4.0(J9.6(L) 8.1(X9.4(L) 9.1(Y9.0(L)  Hematocrit 35.0 - 47.0 % 29.2(L) 29.0(L) 27.3(L)  Platelets 150 - 440 K/uL 94(L) 72(L) 120(L)    Hospital Course:  Nadyne CoombesJena Housh is a 21 y.o. G0.  Was admitted from the ED with profound and symptomatic anemia and thrombocytopenia. She was transfused 5u PRBC, 3u Platelets and had a pelvic ultrasound that showed an endometrial thickening of 16mm. She was seen by hematology. She recovered from her symptoms and was deemed stable for discharge to home.   Discharge Exam: Blood pressure (!) 124/57, pulse 90, temperature 98.7 F (37.1 C), temperature source Oral, resp. rate 17, height 5\' 4"  (1.626 m), weight 108.9 kg (240 lb), last menstrual period 06/06/2017, SpO2 99 %. General appearance: alert and no distress  Resp: clear to auscultation bilaterally, normal respiratory effort Cardio: regular rate and rhythm  GI: soft, non-tender; bowel sounds normal; no masses, no organomegaly.  Pelvic: no blood on pad  Extremities: extremities normal, atraumatic, no cyanosis or edema and Homans sign is negative, no sign of DVT  Discharged Condition: Stable  Disposition: 01-Home or Self Care  Discharge Instructions    Diet - low sodium heart healthy    Complete by:  As directed    Increase activity slowly    Complete by:  As directed      Allergies as of 06/12/2017   No Known Allergies     Medication List    You have not been prescribed any medications.          Discharge Care Instructions         Start     Ordered   06/12/17 0000  Diet - low sodium heart healthy     06/12/17 1157   06/12/17 0000  Increase activity slowly     06/12/17 1157     Follow-up Information    Jeralyn RuthsFinnegan, Timothy J, MD Follow up on 06/26/2017.   Specialty:  Oncology Why:  Wednesday at 8:45am for Labs, Iron treatment Contact information: [address] [place] [id number] [phone]        Kelcee Bjorn, Elenora Fenderhelsea C, MD Follow up in 2 week(s).   Specialty:  Obstetrics and Gynecology Why:  after Dr. Jaynie CollinsFinngan's appointment Contact information: [address] [facility] [place] [address] [phone]           Signed:  Elenora Fenderhelsea C Chene Kasinger Attending Obstetrician & Gynecologist [facility]

## 2017-06-12 NOTE — Progress Notes (Signed)
Obstetric and Gynecology  HD 3  Subjective  Patient doing well, no complaints, tolerating PO intake, ambulating without difficulty, voiding spontaneously.     Denies CP, SOB, F/C, N/V/D, or leg pain.  Still feels great.  Bleeding resolved.  8/28: symptomatically feels great, no VB. mild improvement in amemia and no improvement in thrombocytopenia after transfusion.  Blood bank only allowed one platelet due to protocol.  Ultrasound showed thickened endometrium.  Added 2 more units PRBC and 2 more platelets.  8/27: admitted to floor from ED with profound anemia and thrombocytopenia.  Received 3u PRBC and 1u platelets.  S/p consult by hematology.   Objective  Objective:   Vitals:   06/12/17 0120 06/12/17 0147 06/12/17 0219 06/12/17 0444  BP: (!) 127/50 (!) 122/59 (!) 118/56 (!) 124/57  Pulse: 81 70 71 90  Resp: _0 Temp: 98.8 F (37.1 C) 98.6 F (37 C) 98.4 F (36.9 C) 98.7 F (37.1 C)  TempSrc: Oral Oral Oral Oral  SpO2: 100% 99% 100% 99%  Weight:      Height:        General: NAD, mucous membranes pale Cardiovascular: RRR, no murmurs Pulmonary: CTAB, normal respiratory effort Abdomen: Benign. Non-tender, +BS, no guarding. Extremities: No erythema or cords, no calf tenderness, with normal peripheral pulses.   Labs: Results for orders placed or performed during the hospital encounter of 06/10/17 (from the past 24 hour(s))  Platelet function assay     Status: None   Collection Time: 06/11/17  9:11 AM  Result Value Ref Range   PFA Interpretation           Collagen / Epinephrine 174 0 - 193 seconds  CBC     Status: Abnormal   Collection Time: 06/11/17  5:07 PM  Result Value Ref Range   WBC 13.3 (H) 3.6 - 11.0 K/uL   RBC 3.65 (L) 3.80 - 5.20 MIL/uL   Hemoglobin 9.0 (L) 12.0 - 16.0 g/dL   HCT 27.3 (L) 35.0 - 47.0 %   MCV 74.9 (L) 80.0 - 100.0 fL   MCH 24.7 (L) 26.0 - 34.0 pg   MCHC 33.0 32.0 - 36.0 g/dL   RDW 29.2 (H) 11.5 - 14.5 %   Platelets 120 (L) 150 -  440 K/uL  CBC     Status: Abnormal   Collection Time: 06/11/17  8:28 PM  Result Value Ref Range   WBC 13.2 (H) 3.6 - 11.0 K/uL   RBC 3.83 3.80 - 5.20 MIL/uL   Hemoglobin 9.4 (L) 12.0 - 16.0 g/dL   HCT 29.0 (L) 35.0 - 47.0 %   MCV 75.7 (L) 80.0 - 100.0 fL   MCH 24.6 (L) 26.0 - 34.0 pg   MCHC 32.5 32.0 - 36.0 g/dL   RDW 29.5 (H) 11.5 - 14.5 %   Platelets 72 (L) 150 - 440 K/uL  Prepare Pheresed Platelets     Status: None (Preliminary result)   Collection Time: 06/11/17 11:00 PM  Result Value Ref Range   Unit Number [id number]    Blood Component Type PLTP LR1 PAS    Unit division 00    Status of Unit ISSUED    Transfusion Status OK TO TRANSFUSE     Cultures: Results for orders placed or performed during the hospital encounter of 09/09/16  Culture, group A strep     Status: None   Collection Time: 09/09/16 10:56 PM  Result Value Ref Range Status   Specimen Description THROAT  Final  Special Requests NONE  Final   Culture   Final    NO GROUP A STREP (S.PYOGENES) ISOLATED Performed at [facility]    Report Status 09/12/2016 FINAL  Final     Assessment   21 y.o. Hospital Day: 3 with profound anemia, thrombocytopenia.  Plan   1. Anemia:  S/p 5u PRBC with improvement from 4.1 --> 9.4, 16.2--> 29.0.    2. Thrombocytopenia:  S/p 3 units:  38--> pending  Dr. Finnegan from Hematology saw patient in consult and ordered B-vitamin, iron and hemoglobin studies - some still pending.  Iron elevated, TIBC elevated, Ferritin low. B12 low-normal. Folate normal. Peripheral smear no blasts.  Considering bone marrow biopsy.  3. Heavy menses:  Hormonal and non-hormonal manipulation may be necessary for her for the remainder of her reproductively able time, to decrease the severity of her menses and blood loss.  Will discuss this with her and f/u as outpatient once this acute incident is resolved, and etiology is determined. Ultrasound showed thickening to 16mm.   4. Pending  platelets and discussion with heme, will consider discharge today.  Other option would be to take her OR today for hysteroscopy D&C.      ----- Chelsea Ward, MD Attending Obstetrician and Gynecologist [facility], Department of OB/GYN [facility]     

## 2017-06-12 NOTE — Progress Notes (Signed)
Notified blood bank of orders for platelets. States if coming from [place] it would be 1.5 hrs and if coming from [place] it would be longer. Will give meds when platelets available.

## 2017-06-12 NOTE — Progress Notes (Signed)
Patient discharge teaching given, including activity, diet, follow-up appoints, and medications. Patient verbalized understanding of all discharge instructions. IV access was d/c'd. Vitals are stable. Skin is intact except as charted in most recent assessments. Pt refused to be escorted out by, to be driven home by family.  Kara Bell  

## 2017-06-12 NOTE — Discharge Instructions (Signed)
Anemia, Nonspecific Anemia is a condition in which the concentration of red blood cells or hemoglobin in the blood is below normal. Hemoglobin is a substance in red blood cells that carries oxygen to the tissues of the body. Anemia results in not enough oxygen reaching these tissues. What are the causes? Common causes of anemia include:  Excessive bleeding. Bleeding may be internal or external. This includes excessive bleeding from periods (in women) or from the intestine.  Poor nutrition.  Chronic kidney, thyroid, and liver disease.  Bone marrow disorders that decrease red blood cell production.  Cancer and treatments for cancer.  HIV, AIDS, and their treatments.  Spleen problems that increase red blood cell destruction.  Blood disorders.  Excess destruction of red blood cells due to infection, medicines, and autoimmune disorders. What are the signs or symptoms?  Minor weakness.  Dizziness.  Headache.  Palpitations.  Shortness of breath, especially with exercise.  Paleness.  Cold sensitivity.  Indigestion.  Nausea.  Difficulty sleeping.  Difficulty concentrating. Symptoms may occur suddenly or they may develop slowly. How is this diagnosed? Additional blood tests are often needed. These help your health care provider determine the best treatment. Your health care provider will check your stool for blood and look for other causes of blood loss. How is this treated? Treatment varies depending on the cause of the anemia. Treatment can include:  Supplements of iron, vitamin B12, or folic acid.  Hormone medicines.  A blood transfusion. This may be needed if blood loss is severe.  Hospitalization. This may be needed if there is significant continual blood loss.  Dietary changes.  Spleen removal. Follow these instructions at home: Keep all follow-up appointments. It often takes many weeks to correct anemia, and having your health care provider check on your  condition and your response to treatment is very important. Get help right away if:  You develop extreme weakness, shortness of breath, or chest pain.  You become dizzy or have trouble concentrating.  You develop heavy vaginal bleeding.  You develop a rash.  You have bloody or black, tarry stools.  You faint.  You vomit up blood.  You vomit repeatedly.  You have abdominal pain.  You have a fever or persistent symptoms for more than 2-3 days.  You have a fever and your symptoms suddenly get worse.  You are dehydrated. This information is not intended to replace advice given to you by your health care provider. Make sure you discuss any questions you have with your health care provider. Document Released: 11/08/2004 Document Revised: 03/14/2016 Document Reviewed: 03/27/2013 Elsevier Interactive Patient Education  2017 Elsevier Inc.  

## 2017-06-13 LAB — TYPE AND SCREEN
ABO/RH(D): A POS
ANTIBODY SCREEN: NEGATIVE
UNIT DIVISION: 0
UNIT DIVISION: 0
UNIT DIVISION: 0
Unit division: 0
Unit division: 0
Unit division: 0
Unit division: 0

## 2017-06-13 LAB — BPAM RBC
BLOOD PRODUCT EXPIRATION DATE: 201809072359
BLOOD PRODUCT EXPIRATION DATE: 201809152359
BLOOD PRODUCT EXPIRATION DATE: 201809242359
Blood Product Expiration Date: 201809092359
Blood Product Expiration Date: 201809152359
Blood Product Expiration Date: 201809152359
Blood Product Expiration Date: 201809242359
ISSUE DATE / TIME: 201808271818
ISSUE DATE / TIME: 201808281050
ISSUE DATE / TIME: 201808281507
ISSUE DATE / TIME: 201808271156
ISSUE DATE / TIME: 201808280205
ISSUE DATE / TIME: 201808281000
UNIT TYPE AND RH: 5100
UNIT TYPE AND RH: 6200
UNIT TYPE AND RH: 6200
UNIT TYPE AND RH: 6200
Unit Type and Rh: 5100
Unit Type and Rh: 600
Unit Type and Rh: 600

## 2017-06-13 LAB — BPAM PLATELET PHERESIS
Blood Product Expiration Date: 201808312359
ISSUE DATE / TIME: 201808290120
Unit Type and Rh: 5100

## 2017-06-13 LAB — VON WILLEBRAND PANEL
Coagulation Factor VIII: 290 % — ABNORMAL HIGH (ref 57–163)
Ristocetin Co-factor, Plasma: 171 % (ref 50–200)
Von Willebrand Antigen, Plasma: 295 % — ABNORMAL HIGH (ref 50–200)

## 2017-06-13 LAB — FACTOR 2 ASSAY: Factor II Activity: 124 % (ref 50–154)

## 2017-06-13 LAB — PREPARE PLATELET PHERESIS: UNIT DIVISION: 0

## 2017-06-13 LAB — DIRECT PLATELET ANTIBODY
PLT ASSOC. ANTI-IIB/IIIA: NEGATIVE
Plt Assoc. Anti-IA/IIA: NEGATIVE
Plt Assoc. Anti-IB/IX: NEGATIVE

## 2017-06-13 LAB — COAG STUDIES INTERP REPORT

## 2017-06-19 LAB — VON WILLEBRAND FACTOR SCREEN
APTT: 23.7 s
Factor VIII Activity: 249 % — ABNORMAL HIGH
VON WILLEBRAND FACTOR ACTIVITY: 170 %
VON WILLEBRAND FACTOR ANTIGEN: 279 % — AB

## 2017-06-26 ENCOUNTER — Ambulatory Visit: Payer: Medicaid Other

## 2017-06-26 ENCOUNTER — Other Ambulatory Visit: Payer: Medicaid Other

## 2017-06-26 ENCOUNTER — Inpatient Hospital Stay: Payer: Medicaid Other | Admitting: Oncology

## 2017-06-29 NOTE — Progress Notes (Signed)
[place]  [phone] [phone]) [phone]  ID: Kara Bell OB: 03-25-1996  MR#: [id number]  VQQ#:[id number]  Patient Care Team: Patient, No Pcp Per as PCP - General (General Practice)  CHIEF COMPLAINT: Iron deficiency anemia  INTERVAL HISTORY: Patient is a 21 year old female who returns to clinic today for repeat laboratory work, further evaluation, in hospital follow-up. She was recently admitted to the hospital with severe anemia and thrombocytopenia secondary to heavy menses. She now feels well and back to her baseline. She has had no further bleeding. She has no neurologic complaints. She denies any recent fevers. She has a good appetite and denies weight loss. She has no chest pain or shortness of breath. She denies any nausea, vomiting, constipation, or diarrhea. She has no melena or hematochezia. She has no urinary complaints. Patient offers no specific complaints today.   REVIEW OF SYSTEMS:   Review of Systems  Constitutional: Negative for fever, malaise/fatigue and weight loss.  Respiratory: Negative.  Negative for cough, hemoptysis and wheezing.   Cardiovascular: Negative.  Negative for chest pain and leg swelling.  Gastrointestinal: Negative.  Negative for abdominal pain, blood in stool and melena.  Genitourinary: Negative.  Negative for hematuria.  Musculoskeletal: Negative.   Skin: Negative.  Negative for rash.  Neurological: Negative.  Negative for weakness.  Psychiatric/Behavioral: Negative.  The patient is not nervous/anxious.     As per HPI. Otherwise, a complete review of systems is negative.  PAST MEDICAL HISTORY: Past Medical History:  Diagnosis Date  . Anemia     PAST SURGICAL HISTORY: History reviewed. No pertinent surgical history.  FAMILY HISTORY: Family History  Problem Relation Age of Onset  . Diabetes Mother        Type 2   . Hypertension Mother   . Arthritis Mother   . Hypertension Father   . Healthy Sister   .  Hypertension Brother   . Diabetes Maternal Aunt   . Hypertension Maternal Aunt   . Healthy Maternal Uncle   . Arthritis Paternal Aunt   . Scoliosis Maternal Grandmother   . Hypertension Maternal Grandmother   . Hypertension Paternal Grandmother   . Hypertension Paternal Grandfather   . Healthy Sister   . Healthy Brother   . Diabetes Maternal Aunt   . Hypertension Maternal Aunt   . Diabetes Paternal Uncle     ADVANCED DIRECTIVES (Y/N):  N  HEALTH MAINTENANCE: Social History  Substance Use Topics  . Smoking status: Never Smoker  . Smokeless tobacco: Never Used  . Alcohol use No     Colonoscopy:  PAP:  Bone density:  Lipid panel:  No Known Allergies  Current Outpatient Prescriptions  Medication Sig Dispense Refill  . Ferrous Sulfate (IRON) 325 (65 Fe) MG TABS Take 1 tablet by mouth daily.     No current facility-administered medications for this visit.     OBJECTIVE: Vitals:   07/01/17 1404  BP: 140/70  Pulse: 63  Resp: 18  Temp: 97.7 F (36.5 C)     Body mass index is 44.1 kg/m.    ECOG FS:0 - Asymptomatic  General: Well-developed, well-nourished, no acute distress. Eyes: Pink conjunctiva, anicteric sclera. HEENT: Normocephalic, moist mucous membranes, clear oropharnyx. Lungs: Clear to auscultation bilaterally. Heart: Regular rate and rhythm. No rubs, murmurs, or gallops. Abdomen: Soft, nontender, nondistended. No organomegaly noted, normoactive bowel sounds. Musculoskeletal: No edema, cyanosis, or clubbing. Neuro: Alert, answering all questions appropriately. Cranial nerves grossly intact. Skin: No rashes or petechiae noted. Psych: Normal affect.  Lymphatics: No cervical, calvicular, axillary or inguinal LAD.   LAB RESULTS:  Lab Results  Component Value Date   NA 139 06/10/2017   K 3.5 06/10/2017   CL 106 06/10/2017   CO2 26 06/10/2017   GLUCOSE 117 (H) 06/10/2017   BUN 14 06/10/2017   CREATININE 0.59 06/10/2017   CALCIUM 8.7 (L) 06/10/2017    PROT 7.3 06/10/2017   ALBUMIN 3.1 (L) 06/10/2017   AST 20 06/10/2017   ALT 13 (L) 06/10/2017   ALKPHOS 39 06/10/2017   BILITOT 0.1 (L) 06/10/2017   GFRNONAA >60 06/10/2017   GFRAA >60 06/10/2017    Lab Results  Component Value Date   WBC 6.7 07/01/2017   NEUTROABS 3.8 07/01/2017   HGB 9.6 (L) 07/01/2017   HCT 29.4 (L) 07/01/2017   MCV 72.9 (L) 07/01/2017   PLT 637 (H) 07/01/2017   Lab Results  Component Value Date   IRON 21 (L) 07/01/2017   TIBC 515 (H) 07/01/2017   IRONPCTSAT 4 (L) 07/01/2017   Lab Results  Component Value Date   FERRITIN 7 (L) 07/01/2017     STUDIES: Dg Chest 2 View  Result Date: 06/10/2017 CLINICAL DATA:  Anemia. Dizziness x 2 days. Pt stated she fainted today and was told she has a low blood count today. Never smoker No sx EXAM: CHEST  2 VIEW COMPARISON:  None. FINDINGS: Heart size and mediastinal contours are within normal limits. Lungs are clear. Lung volumes are normal. No pleural effusion or pneumothorax. Mild dextroscoliosis of the mid/upper thoracic spine. No acute or suspicious osseous finding. IMPRESSION: No active cardiopulmonary disease. No evidence of pneumonia or pulmonary edema. Mild scoliosis of the thoracic spine, measuring approximately 6 degrees. Electronically Signed   By: Franki Cabot M.D.   On: 06/10/2017 11:45   US Pelvis Complete  Result Date: 06/11/2017 CLINICAL DATA:  Menorrhagia.  The patient is not sexually active. EXAM: TRANSABDOMINAL ULTRASOUND OF PELVIS TECHNIQUE: Transabdominal ultrasound examination of the pelvis was performed including evaluation of the uterus, ovaries, adnexal regions, and pelvic cul-de-sac. COMPARISON:  None in PACs FINDINGS: Uterus Measurements: 9.9 x 3.6 x 4.8 cm. The myometrial echotexture is normal. Endometrium Thickness: 16.4 mm. No discrete endometrial masses or fluid collections are observed. Right ovary Measurements: 4.9 x 2.3 x 3.0 cm. Normal appearance/no adnexal mass. Left ovary Measurements: 4.9  x 2.6 x 3.1 cm. Normal appearance/no adnexal mass. Other findings:  No abnormal free fluid. IMPRESSION: Thickened endometrium. The onset of the patient's most recent menstrual period was June 06, 2017. No discrete endometrial mass is observed. If bleeding remains unresponsive to hormonal or medical therapy, focal lesion work-up with sonohysterogram should be considered. Endometrial biopsy should also be considered in pre-menopausal patients at high risk for endometrial carcinoma. (Ref: Radiological Reasoning: Algorithmic Workup of Abnormal Vaginal Bleeding with Endovaginal Sonography and Sonohysterography. AJR 2008; 628:Z66-29) Normal appearance of the ovaries and adnexal regions. No free pelvic fluid. Electronically Signed   By: David  Martinique M.D.   On: 06/11/2017 08:57    ASSESSMENT: Iron deficiency anemia  PLAN:    1. Iron deficiency anemia: Secondary to heavy menses. Patient's hemoglobin is stable since discharge at 9.6. She continues to have a significantly decreased iron panel. Previously, the remainder of her laboratory work was either negative or within normal limits. No intervention is needed at this time. Patient has been instructed to continue oral iron supplementation. Return to clinic in 6 weeks with repeat laboratory work and further evaluation. If there is no improvement  in patient's hemoglobin at this time, will initiate IV Feraheme. 2. Thrombocytosis: Patient was found to be severely thrombocytopenic in the hospital and received platelet transfusions. Her elevated platelet count is likely secondary to her severe iron deficiency. Monitor. Patient does not require bone marrow biopsy. 3. Menstrual bleeding: Continue follow-up and treatment per OB/GYN.  Approximately 30 minutes was spent in discussion of which greater than 50% was consultation.  Patient expressed understanding and was in agreement with this plan. She also understands that She can call clinic at any time with any  questions, concerns, or complaints.    Lloyd Huger, MD   07/01/2017 3:48 PM

## 2017-07-01 ENCOUNTER — Inpatient Hospital Stay: Payer: Medicaid Other | Attending: Oncology | Admitting: Oncology

## 2017-07-01 ENCOUNTER — Encounter: Payer: Self-pay | Admitting: Oncology

## 2017-07-01 ENCOUNTER — Inpatient Hospital Stay: Payer: Medicaid Other

## 2017-07-01 VITALS — BP 140/70 | HR 63 | Temp 97.7°F | Resp 18 | Wt 256.9 lb

## 2017-07-01 DIAGNOSIS — N920 Excessive and frequent menstruation with regular cycle: Secondary | ICD-10-CM | POA: Insufficient documentation

## 2017-07-01 DIAGNOSIS — D500 Iron deficiency anemia secondary to blood loss (chronic): Secondary | ICD-10-CM | POA: Insufficient documentation

## 2017-07-01 DIAGNOSIS — Z79899 Other long term (current) drug therapy: Secondary | ICD-10-CM | POA: Diagnosis not present

## 2017-07-01 DIAGNOSIS — D6959 Other secondary thrombocytopenia: Secondary | ICD-10-CM | POA: Insufficient documentation

## 2017-07-01 LAB — CBC WITH DIFFERENTIAL/PLATELET
Basophils Absolute: 0.1 10*3/uL (ref 0–0.1)
Basophils Relative: 2 %
EOS ABS: 0.1 10*3/uL (ref 0–0.7)
EOS PCT: 1 %
HCT: 29.4 % — ABNORMAL LOW (ref 35.0–47.0)
Hemoglobin: 9.6 g/dL — ABNORMAL LOW (ref 12.0–16.0)
LYMPHS PCT: 33 %
Lymphs Abs: 2.2 10*3/uL (ref 1.0–3.6)
MCH: 23.9 pg — ABNORMAL LOW (ref 26.0–34.0)
MCHC: 32.8 g/dL (ref 32.0–36.0)
MCV: 72.9 fL — AB (ref 80.0–100.0)
Monocytes Absolute: 0.5 10*3/uL (ref 0.2–0.9)
Monocytes Relative: 8 %
NEUTROS PCT: 56 %
Neutro Abs: 3.8 10*3/uL (ref 1.4–6.5)
PLATELETS: 637 10*3/uL — AB (ref 150–440)
RBC: 4.02 MIL/uL (ref 3.80–5.20)
RDW: 28.6 % — ABNORMAL HIGH (ref 11.5–14.5)
WBC: 6.7 10*3/uL (ref 3.6–11.0)

## 2017-07-01 LAB — SAMPLE TO BLOOD BANK

## 2017-07-01 LAB — FERRITIN: FERRITIN: 7 ng/mL — AB (ref 11–307)

## 2017-07-01 LAB — IRON AND TIBC
Iron: 21 ug/dL — ABNORMAL LOW (ref 28–170)
SATURATION RATIOS: 4 % — AB (ref 10.4–31.8)
TIBC: 515 ug/dL — AB (ref 250–450)
UIBC: 494 ug/dL

## 2017-08-07 NOTE — Progress Notes (Deleted)
[place]  [phone] [phone]) [phone]  ID: Kara Bell OB: November 06, 1995  MR#: [id number]  EML#:[id number]  Patient Care Team: Patient, No Pcp Per as PCP - General (General Practice)  CHIEF COMPLAINT: Iron deficiency anemia  INTERVAL HISTORY: Patient is a 21 year old female who returns to clinic today for repeat laboratory work, further evaluation, in hospital follow-up. She was recently admitted to the hospital with severe anemia and thrombocytopenia secondary to heavy menses. She now feels well and back to her baseline. She has had no further bleeding. She has no neurologic complaints. She denies any recent fevers. She has a good appetite and denies weight loss. She has no chest pain or shortness of breath. She denies any nausea, vomiting, constipation, or diarrhea. She has no melena or hematochezia. She has no urinary complaints. Patient offers no specific complaints today.   REVIEW OF SYSTEMS:   Review of Systems  Constitutional: Negative for fever, malaise/fatigue and weight loss.  Respiratory: Negative.  Negative for cough, hemoptysis and wheezing.   Cardiovascular: Negative.  Negative for chest pain and leg swelling.  Gastrointestinal: Negative.  Negative for abdominal pain, blood in stool and melena.  Genitourinary: Negative.  Negative for hematuria.  Musculoskeletal: Negative.   Skin: Negative.  Negative for rash.  Neurological: Negative.  Negative for weakness.  Psychiatric/Behavioral: Negative.  The patient is not nervous/anxious.     As per HPI. Otherwise, a complete review of systems is negative.  PAST MEDICAL HISTORY: Past Medical History:  Diagnosis Date  . Anemia     PAST SURGICAL HISTORY: No past surgical history on file.  FAMILY HISTORY: Family History  Problem Relation Age of Onset  . Diabetes Mother        Type 2   . Hypertension Mother   . Arthritis Mother   . Hypertension Father   . Healthy Sister   . Hypertension  Brother   . Diabetes Maternal Aunt   . Hypertension Maternal Aunt   . Healthy Maternal Uncle   . Arthritis Paternal Aunt   . Scoliosis Maternal Grandmother   . Hypertension Maternal Grandmother   . Hypertension Paternal Grandmother   . Hypertension Paternal Grandfather   . Healthy Sister   . Healthy Brother   . Diabetes Maternal Aunt   . Hypertension Maternal Aunt   . Diabetes Paternal Uncle     ADVANCED DIRECTIVES (Y/N):  N  HEALTH MAINTENANCE: Social History  Substance Use Topics  . Smoking status: Never Smoker  . Smokeless tobacco: Never Used  . Alcohol use No     Colonoscopy:  PAP:  Bone density:  Lipid panel:  No Known Allergies  Current Outpatient Prescriptions  Medication Sig Dispense Refill  . Ferrous Sulfate (IRON) 325 (65 Fe) MG TABS Take 1 tablet by mouth daily.     No current facility-administered medications for this visit.     OBJECTIVE: There were no vitals filed for this visit.   There is no height or weight on file to calculate BMI.    ECOG FS:0 - Asymptomatic  General: Well-developed, well-nourished, no acute distress. Eyes: Pink conjunctiva, anicteric sclera. HEENT: Normocephalic, moist mucous membranes, clear oropharnyx. Lungs: Clear to auscultation bilaterally. Heart: Regular rate and rhythm. No rubs, murmurs, or gallops. Abdomen: Soft, nontender, nondistended. No organomegaly noted, normoactive bowel sounds. Musculoskeletal: No edema, cyanosis, or clubbing. Neuro: Alert, answering all questions appropriately. Cranial nerves grossly intact. Skin: No rashes or petechiae noted. Psych: Normal affect. Lymphatics: No cervical, calvicular, axillary or inguinal LAD.  LAB RESULTS:  Lab Results  Component Value Date   NA 139 06/10/2017   K 3.5 06/10/2017   CL 106 06/10/2017   CO2 26 06/10/2017   GLUCOSE 117 (H) 06/10/2017   BUN 14 06/10/2017   CREATININE 0.59 06/10/2017   CALCIUM 8.7 (L) 06/10/2017   PROT 7.3 06/10/2017   ALBUMIN 3.1  (L) 06/10/2017   AST 20 06/10/2017   ALT 13 (L) 06/10/2017   ALKPHOS 39 06/10/2017   BILITOT 0.1 (L) 06/10/2017   GFRNONAA >60 06/10/2017   GFRAA >60 06/10/2017    Lab Results  Component Value Date   WBC 6.7 07/01/2017   NEUTROABS 3.8 07/01/2017   HGB 9.6 (L) 07/01/2017   HCT 29.4 (L) 07/01/2017   MCV 72.9 (L) 07/01/2017   PLT 637 (H) 07/01/2017   Lab Results  Component Value Date   IRON 21 (L) 07/01/2017   TIBC 515 (H) 07/01/2017   IRONPCTSAT 4 (L) 07/01/2017   Lab Results  Component Value Date   FERRITIN 7 (L) 07/01/2017     STUDIES: No results found.  ASSESSMENT: Iron deficiency anemia  PLAN:    1. Iron deficiency anemia: Secondary to heavy menses. Patient's hemoglobin is stable since discharge at 9.6. She continues to have a significantly decreased iron panel. Previously, the remainder of her laboratory work was either negative or within normal limits. No intervention is needed at this time. Patient has been instructed to continue oral iron supplementation. Return to clinic in 6 weeks with repeat laboratory work and further evaluation. If there is no improvement in patient's hemoglobin at this time, will initiate IV Feraheme. 2. Thrombocytosis: Patient was found to be severely thrombocytopenic in the hospital and received platelet transfusions. Her elevated platelet count is likely secondary to her severe iron deficiency. Monitor. Patient does not require bone marrow biopsy. 3. Menstrual bleeding: Continue follow-up and treatment per OB/GYN.  Approximately 30 minutes was spent in discussion of which greater than 50% was consultation.  Patient expressed understanding and was in agreement with this plan. She also understands that She can call clinic at any time with any questions, concerns, or complaints.    Lloyd Huger, MD   08/07/2017 12:57 PM

## 2017-08-08 ENCOUNTER — Inpatient Hospital Stay: Payer: Self-pay

## 2017-08-08 ENCOUNTER — Inpatient Hospital Stay: Payer: Self-pay | Admitting: Oncology

## 2017-08-08 ENCOUNTER — Encounter: Payer: Self-pay | Admitting: Oncology

## 2017-08-14 NOTE — Progress Notes (Deleted)
[place]  [phone] [phone]  ID: Kara Bell OB: 09-Oct-1996  MR#: [id number]  EZM#:[id number]  Patient Care Team: Patient, No Pcp Per as PCP - General (General Practice)  CHIEF COMPLAINT: Iron deficiency anemia  INTERVAL HISTORY: Patient is a 21 year old female who returns to clinic today for repeat laboratory work, further evaluation, in hospital follow-up. She was recently admitted to the hospital with severe anemia and thrombocytopenia secondary to heavy menses. She now feels well and back to her baseline. She has had no further bleeding. She has no neurologic complaints. She denies any recent fevers. She has a good appetite and denies weight loss. She has no chest pain or shortness of breath. She denies any nausea, vomiting, constipation, or diarrhea. She has no melena or hematochezia. She has no urinary complaints. Patient offers no specific complaints today.   REVIEW OF SYSTEMS:   Review of Systems  Constitutional: Negative for fever, malaise/fatigue and weight loss.  Respiratory: Negative.  Negative for cough, hemoptysis and wheezing.   Cardiovascular: Negative.  Negative for chest pain and leg swelling.  Gastrointestinal: Negative.  Negative for abdominal pain, blood in stool and melena.  Genitourinary: Negative.  Negative for hematuria.  Musculoskeletal: Negative.   Skin: Negative.  Negative for rash.  Neurological: Negative.  Negative for weakness.  Psychiatric/Behavioral: Negative.  The patient is not nervous/anxious.     As per HPI. Otherwise, a complete review of systems is negative.  PAST MEDICAL HISTORY: Past Medical History:  Diagnosis Date  . Anemia     PAST SURGICAL HISTORY: No past surgical history on file.  FAMILY HISTORY: Family History  Problem Relation Age of Onset  . Diabetes Mother        Type 2   . Hypertension Mother   . Arthritis Mother   . Hypertension Father   . Healthy Sister   . Hypertension  Brother   . Diabetes Maternal Aunt   . Hypertension Maternal Aunt   . Healthy Maternal Uncle   . Arthritis Paternal Aunt   . Scoliosis Maternal Grandmother   . Hypertension Maternal Grandmother   . Hypertension Paternal Grandmother   . Hypertension Paternal Grandfather   . Healthy Sister   . Healthy Brother   . Diabetes Maternal Aunt   . Hypertension Maternal Aunt   . Diabetes Paternal Uncle     ADVANCED DIRECTIVES (Y/N):  N  HEALTH MAINTENANCE: Social History  Substance Use Topics  . Smoking status: Never Smoker  . Smokeless tobacco: Never Used  . Alcohol use No     Colonoscopy:  PAP:  Bone density:  Lipid panel:  No Known Allergies  Current Outpatient Prescriptions  Medication Sig Dispense Refill  . Ferrous Sulfate (IRON) 325 (65 Fe) MG TABS Take 1 tablet by mouth daily.     No current facility-administered medications for this visit.     OBJECTIVE: There were no vitals filed for this visit.   There is no height or weight on file to calculate BMI.    ECOG FS:0 - Asymptomatic  General: Well-developed, well-nourished, no acute distress. Eyes: Pink conjunctiva, anicteric sclera. HEENT: Normocephalic, moist mucous membranes, clear oropharnyx. Lungs: Clear to auscultation bilaterally. Heart: Regular rate and rhythm. No rubs, murmurs, or gallops. Abdomen: Soft, nontender, nondistended. No organomegaly noted, normoactive bowel sounds. Musculoskeletal: No edema, cyanosis, or clubbing. Neuro: Alert, answering all questions appropriately. Cranial nerves grossly intact. Skin: No rashes or petechiae noted. Psych: Normal affect. Lymphatics: No cervical, calvicular, axillary or inguinal LAD.  LAB RESULTS:  Lab Results  Component Value Date   NA 139 06/10/2017   K 3.5 06/10/2017   CL 106 06/10/2017   CO2 26 06/10/2017   GLUCOSE 117 (H) 06/10/2017   BUN 14 06/10/2017   CREATININE 0.59 06/10/2017   CALCIUM 8.7 (L) 06/10/2017   PROT 7.3 06/10/2017   ALBUMIN 3.1  (L) 06/10/2017   AST 20 06/10/2017   ALT 13 (L) 06/10/2017   ALKPHOS 39 06/10/2017   BILITOT 0.1 (L) 06/10/2017   GFRNONAA >60 06/10/2017   GFRAA >60 06/10/2017    Lab Results  Component Value Date   WBC 6.7 07/01/2017   NEUTROABS 3.8 07/01/2017   HGB 9.6 (L) 07/01/2017   HCT 29.4 (L) 07/01/2017   MCV 72.9 (L) 07/01/2017   PLT 637 (H) 07/01/2017   Lab Results  Component Value Date   IRON 21 (L) 07/01/2017   TIBC 515 (H) 07/01/2017   IRONPCTSAT 4 (L) 07/01/2017   Lab Results  Component Value Date   FERRITIN 7 (L) 07/01/2017     STUDIES: No results found.  ASSESSMENT: Iron deficiency anemia  PLAN:    1. Iron deficiency anemia: Secondary to heavy menses. Patient's hemoglobin is stable since discharge at 9.6. She continues to have a significantly decreased iron panel. Previously, the remainder of her laboratory work was either negative or within normal limits. No intervention is needed at this time. Patient has been instructed to continue oral iron supplementation. Return to clinic in 6 weeks with repeat laboratory work and further evaluation. If there is no improvement in patient's hemoglobin at this time, will initiate IV Feraheme. 2. Thrombocytosis: Patient was found to be severely thrombocytopenic in the hospital and received platelet transfusions. Her elevated platelet count is likely secondary to her severe iron deficiency. Monitor. Patient does not require bone marrow biopsy. 3. Menstrual bleeding: Continue follow-up and treatment per OB/GYN.  Approximately 30 minutes was spent in discussion of which greater than 50% was consultation.  Patient expressed understanding and was in agreement with this plan. She also understands that She can call clinic at any time with any questions, concerns, or complaints.    Lloyd Huger, MD   08/14/2017 12:54 PM

## 2017-08-19 ENCOUNTER — Inpatient Hospital Stay: Payer: Self-pay | Admitting: Oncology

## 2017-08-19 ENCOUNTER — Inpatient Hospital Stay: Payer: Self-pay

## 2017-08-19 ENCOUNTER — Encounter: Payer: Self-pay | Admitting: Oncology

## 2017-10-27 ENCOUNTER — Encounter: Payer: Self-pay | Admitting: Emergency Medicine

## 2017-10-27 ENCOUNTER — Other Ambulatory Visit: Payer: Self-pay

## 2017-10-27 ENCOUNTER — Emergency Department
Admission: EM | Admit: 2017-10-27 | Discharge: 2017-10-27 | Disposition: A | Discharge status: Home / Self Care | Payer: Self-pay | Attending: Emergency Medicine | Admitting: Emergency Medicine

## 2017-10-27 DIAGNOSIS — N939 Abnormal uterine and vaginal bleeding, unspecified: Secondary | ICD-10-CM | POA: Insufficient documentation

## 2017-10-27 DIAGNOSIS — R058 Other specified cough: Secondary | ICD-10-CM

## 2017-10-27 DIAGNOSIS — R05 Cough: Secondary | ICD-10-CM | POA: Insufficient documentation

## 2017-10-27 DIAGNOSIS — I10 Essential (primary) hypertension: Secondary | ICD-10-CM | POA: Insufficient documentation

## 2017-10-27 DIAGNOSIS — D649 Anemia, unspecified: Secondary | ICD-10-CM | POA: Insufficient documentation

## 2017-10-27 LAB — BASIC METABOLIC PANEL
Anion gap: 9 (ref 5–15)
BUN: 10 mg/dL (ref 6–20)
CALCIUM: 9.1 mg/dL (ref 8.9–10.3)
CHLORIDE: 106 mmol/L (ref 101–111)
CO2: 24 mmol/L (ref 22–32)
Creatinine, Ser: 0.59 mg/dL (ref 0.44–1.00)
GFR calc non Af Amer: >60 mL/min (ref >60)
Glucose, Bld: 82 mg/dL (ref 65–99)
Potassium: 3.6 mmol/L (ref 3.5–5.1)
SODIUM: 139 mmol/L (ref 135–145)

## 2017-10-27 LAB — URINALYSIS, COMPLETE (UACMP) WITH MICROSCOPIC
BACTERIA UA: NONE SEEN
Bilirubin Urine: NEGATIVE
Glucose, UA: NEGATIVE mg/dL
Ketones, ur: 5 mg/dL — AB
Leukocytes, UA: NEGATIVE
Nitrite: NEGATIVE
PROTEIN: NEGATIVE mg/dL
SPECIFIC GRAVITY, URINE: 1.021 (ref 1.005–1.030)
pH: 6 (ref 5.0–8.0)

## 2017-10-27 LAB — CBC
HEMATOCRIT: 30.1 % — AB (ref 35.0–47.0)
HEMOGLOBIN: 8.9 g/dL — AB (ref 12.0–16.0)
MCH: 19.7 pg — AB (ref 26.0–34.0)
MCHC: 29.7 g/dL — ABNORMAL LOW (ref 32.0–36.0)
MCV: 66.5 fL — ABNORMAL LOW (ref 80.0–100.0)
Platelets: 281 10*3/uL (ref 150–440)
RBC: 4.53 MIL/uL (ref 3.80–5.20)
RDW: 19.3 % — AB (ref 11.5–14.5)
WBC: 6.4 10*3/uL (ref 3.6–11.0)

## 2017-10-27 LAB — TSH: TSH: 0.915 u[IU]/mL (ref 0.350–4.500)

## 2017-10-27 NOTE — ED Provider Notes (Signed)
[facility] Emergency Department Provider Note  ____________________________________________  Time seen: Approximately 3:29 PM  I have reviewed the triage vital signs and the nursing notes.   HISTORY  Chief Complaint Vaginal Bleeding    HPI Nadyne CoombesJena Carrol is a 22 y.o. female admitted to the hospital 9/18 for symptomatic anemia and thrombocytopenia in the setting of dysfunctional uterine bleeding presenting with generalized weakness, fatigue, and nonproductive cough.  The patient reports that for the past 3 days, she has had a mild nonproductive cough without shortness of breath, congestion or rhinorrhea, fever or chills, ear pain or sore throat.  She has had no associated abdominal pain, nausea vomiting or diarrhea.  After the patient's hospitalization, her menstrual periods have been stabilized with OCPs, and she is currently having a normal mild to moderate amount of vaginal bleeding during her placebo pill week, without any associated abdominal cramping, lightheadedness or syncope.  She has continued to follow with her gynecologist, Dr. Elesa MassedWard, for her dysfunctional uterine bleeding.  As an inpatient, the patient was seen by Dr. Orlie DakinFinnegan, hematology, and did have one follow-up with him; she was discharged from hematology care after reassuring trends in her blood work.  The patient is not sexually active, and has had no change in her vaginal discharge.   Past Medical History:  Diagnosis Date  . Anemia     Patient Active Problem List   Diagnosis Date Noted  . Iron deficiency anemia due to chronic blood loss 06/10/2017    History reviewed. No pertinent surgical history.  Current Outpatient Rx  . Order #: [id number] Class: Historical Med    Allergies Patient has no known allergies.  Family History  Problem Relation Age of Onset  . Diabetes Mother        Type 2   . Hypertension Mother   . Arthritis Mother   . Hypertension Father   . Healthy Sister   .  Hypertension Brother   . Diabetes Maternal Aunt   . Hypertension Maternal Aunt   . Healthy Maternal Uncle   . Arthritis Paternal Aunt   . Scoliosis Maternal Grandmother   . Hypertension Maternal Grandmother   . Hypertension Paternal Grandmother   . Hypertension Paternal Grandfather   . Healthy Sister   . Healthy Brother   . Diabetes Maternal Aunt   . Hypertension Maternal Aunt   . Diabetes Paternal Uncle     Social History Social History   Tobacco Use  . Smoking status: Never Smoker  . Smokeless tobacco: Never Used  Substance Use Topics  . Alcohol use: No  . Drug use: No    Review of Systems Constitutional: No fever/chills.  Of generalized fatigue.  Positive generalized weakness. Eyes: No visual changes. ENT: No sore throat. No congestion or rhinorrhea.  No ear pain. Cardiovascular: Denies chest pain. Denies palpitations. Respiratory: Denies shortness of breath.  Positive nonproductive cough. Gastrointestinal: No abdominal pain.  No nausea, no vomiting.  No diarrhea.  No constipation. Genitourinary: Negative for dysuria.  Positive normal withdrawal bleed on OCPs. Musculoskeletal: Negative for back pain. Skin: Negative for rash. Neurological: Negative for headaches. No focal numbness, tingling or weakness.     ____________________________________________   PHYSICAL EXAM:  VITAL SIGNS: ED Triage Vitals  Enc Vitals Group     BP 10/27/17 1345 (!) 148/64     Pulse Rate 10/27/17 1345 (!) 119     Resp 10/27/17 1345 15     Temp 10/27/17 1345 99.7 F (37.6 C)  Temp Source 10/27/17 1345 Oral     SpO2 10/27/17 1345 100 %     Weight 10/27/17 1347 248 lb (112.5 kg)     Height 10/27/17 1347 5\' 5"  (1.651 m)     Head Circumference --      Peak Flow --      Pain Score 10/27/17 1357 0     Pain Loc --      Pain Edu? --      Excl. in GC? --     Constitutional: Alert and oriented. Well appearing and in no acute distress. Answers questions appropriately.  Morbidly  obese.   Eyes: Conjunctivae are normal.  EOMI. No scleral icterus.  No eye discharge. EARS: TMs are clear without any bulge, erythema or fluid.  The canals are clear as well. Head: Atraumatic. Nose: No congestion/rhinnorhea. Mouth/Throat: Mucous membranes are moist.  No posterior pharyngeal erythema, tonsillar swelling or exudate.  The posterior palate is symmetric and the uvula is midline.  No drooling or stridor Neck: No stridor.  Supple.  No JVD.  No meningismus. Cardiovascular: Normal rate, regular rhythm. No murmurs, rubs or gallops.  Respiratory: Normal respiratory effort.  No accessory muscle use or retractions. Lungs CTAB.  No wheezes, rales or ronchi. Gastrointestinal: Soft, nontender and nondistended.  No guarding or rebound.  No peritoneal signs. Genitourinary: Deferred as the patient is not having any abnormal vaginal symptoms, is not sexually active. Musculoskeletal: No LE edema.  Neurologic:  A&Ox3.  Speech is clear.  Face and smile are symmetric.  EOMI.  Moves all extremities well. Skin:  Skin is warm, dry and intact. No rash noted. Psychiatric: Mood and affect are normal. Speech and behavior are normal.  Normal judgement  ____________________________________________   LABS (all labs ordered are listed, but only abnormal results are displayed)  Labs Reviewed  CBC - Abnormal; Notable for the following components:      Result Value   Hemoglobin 8.9 (*)    HCT 30.1 (*)    MCV 66.5 (*)    MCH 19.7 (*)    MCHC 29.7 (*)    RDW 19.3 (*)    All other components within normal limits  URINALYSIS, COMPLETE (UACMP) WITH MICROSCOPIC - Abnormal; Notable for the following components:   Color, Urine YELLOW (*)    APPearance CLEAR (*)    Hgb urine dipstick MODERATE (*)    Ketones, ur 5 (*)    Squamous Epithelial / LPF 0-5 (*)    All other components within normal limits  BASIC METABOLIC PANEL  TSH  POC URINE PREG, ED   ____________________________________________  EKG  ED  ECG REPORT I, Rockne Menghini, the attending physician, personally viewed and interpreted this ECG.   Date: 10/27/2017  EKG Time: 1450  Rate: 74  Rhythm: normal sinus rhythm  Axis: normal  Intervals:none  ST&T Change: No STEMI  ____________________________________________  RADIOLOGY  No results found.  ____________________________________________   PROCEDURES  Procedure(s) performed: None  Procedures  Critical Care performed: No ____________________________________________   INITIAL IMPRESSION / ASSESSMENT AND PLAN / ED COURSE  Pertinent labs & imaging results that were available during my care of the patient were reviewed by me and considered in my medical decision making (see chart for details).  22 y.o. female with a recent hospitalization for anemia and thrombus cytopenia in the setting of dysfunctional uterine bleeding presenting with nonproductive cough, generalized fatigue and weakness.  Overall, the patient is mildly hypertensive but otherwise hemodynamically stable.  She has no  EKG findings which are concerning for ischemia or arrhythmia.  Her urinalysis does not show any evidence of infection.  She does have some blood in there which is consistent with her current menstruation.  Her electrolytes are reassuring.  The patient has a normal white blood cell count with a hematocrit of 30.1 and 8.9, which is not significantly changed from her last testing on 07/01/17 showing a hematocrit of 29.4 with a hemoglobin of 9.6.  Today, her platelets are 281.  It is possible that the patient's generalized fatigue is due to a URI, but I do not see any evidence of otitis media, pharyngitis, or pneumonia today.  I will plan to have the patient drink plenty of fluid, continue her treatment for dysfunctional uterine bleeding, and follow-up both with a primary care physician as well as her gynecologist.  I have spoken with the gynecologist on-call for Dr. Elesa Massed, who will let her know that  the patient came into the emergency department today.  A TSH is pending and will be followed up by the patient with her primary care physician as an outpatient.  Today, the patient does have a blood pressure 148/64, which I will have her follow-up with her primary care physician for recheck.  Return precautions as well as follow-up instructions were discussed with the patient and her mother.  ____________________________________________  FINAL CLINICAL IMPRESSION(S) / ED DIAGNOSES  Final diagnoses:  Nonproductive cough  Vaginal bleeding  Anemia, unspecified type  Hypertension, unspecified type         NEW MEDICATIONS STARTED DURING THIS VISIT:  New Prescriptions   No medications on file      Rockne Menghini, MD 10/27/17 1540

## 2017-10-27 NOTE — ED Triage Notes (Addendum)
Pt reports she has been having menstrual period for about a week, reports heavy bleeding. Pt reports she changes her pad about 2 times in an hr and pad are very saturated. Pt reports about 6 months had heavy bleeding and Hgb was low, reports feeling tired. Pt talks in complete sentences no distress noted.

## 2017-10-27 NOTE — ED Triage Notes (Signed)
First Nurse Note:  Arrives with C/O dizziness.  Patient states she has had similar symptoms in the past, which were related to anemia.  AAOx3.  Skin warm and dry.  Ambulates with easy and steady gait.  Skin color WNL NAD

## 2017-10-27 NOTE — Discharge Instructions (Signed)
Please drink plenty of fluid to stay well hydrated, and get plenty of rest.  Establish a primary care physician who can recheck your blood pressure, which was high today, and follow up on your cough, fatigue, and thyroid test performed in the ED today.  Please make an appointment to follow up with Dr. Elesa MassedWard as well.  Return to the emergency department for severe pain, lightheadedness or fainting, fever, worsening cough, shortness of breath, or any other symptoms concerning to you.

## 2018-08-18 ENCOUNTER — Emergency Department
Admission: EM | Admit: 2018-08-18 | Discharge: 2018-08-18 | Disposition: A | Discharge status: Home / Self Care | Payer: Self-pay | Attending: Emergency Medicine | Admitting: Emergency Medicine

## 2018-08-18 ENCOUNTER — Other Ambulatory Visit: Payer: Self-pay

## 2018-08-18 ENCOUNTER — Encounter: Payer: Self-pay | Admitting: Emergency Medicine

## 2018-08-18 DIAGNOSIS — Z79899 Other long term (current) drug therapy: Secondary | ICD-10-CM | POA: Insufficient documentation

## 2018-08-18 DIAGNOSIS — N390 Urinary tract infection, site not specified: Secondary | ICD-10-CM | POA: Insufficient documentation

## 2018-08-18 LAB — URINALYSIS, COMPLETE (UACMP) WITH MICROSCOPIC
Bacteria, UA: NONE SEEN
Bilirubin Urine: NEGATIVE
GLUCOSE, UA: NEGATIVE mg/dL
Ketones, ur: 20 mg/dL — AB
NITRITE: NEGATIVE
Protein, ur: 100 mg/dL — AB
SPECIFIC GRAVITY, URINE: 1.02 (ref 1.005–1.030)
pH: 6 (ref 5.0–8.0)

## 2018-08-18 LAB — POCT PREGNANCY, URINE: Preg Test, Ur: NEGATIVE

## 2018-08-18 MED ORDER — NITROFURANTOIN MONOHYD MACRO 100 MG PO CAPS: 100.0000 mg | ORAL_CAPSULE | Freq: Two times a day (BID) | ORAL | 0 refills | Status: DC

## 2018-08-18 MED ORDER — PHENAZOPYRIDINE HCL 200 MG PO TABS: 200.0000 mg | ORAL_TABLET | Freq: Three times a day (TID) | ORAL | 0 refills | Status: DC | PRN

## 2018-08-18 NOTE — ED Notes (Signed)
See triage note  Presents with urinary freq and dysuria this am  No fever or other sx's

## 2018-08-18 NOTE — ED Triage Notes (Signed)
Pt reports that she has pain when she urinates and has had frequency. States that it started today.

## 2018-08-18 NOTE — ED Provider Notes (Signed)
[facility] Emergency Department Provider Note  ____________________________________________   First MD Initiated Contact with Patient 08/18/18 1634     (approximate)  I have reviewed the triage vital signs and the nursing notes.   HISTORY  Chief Complaint Urinary Tract Infection    HPI Kara Bell is a 22 y.o. female complains of dysuria.  Symptoms started this morning.  She states that it burns when she pees.  She talked to her sister and her sister states that sounds like it is a UTI so she decided to come to the emergency department.  She denies any fever, chills or vomiting.    Past Medical History:  Diagnosis Date  . Anemia     Patient Active Problem List   Diagnosis Date Noted  . Iron deficiency anemia due to chronic blood loss 06/10/2017    History reviewed. No pertinent surgical history.  Prior to Admission medications   Medication Sig Start Date End Date Taking? Authorizing Provider  Ferrous Sulfate (IRON) 325 (65 Fe) MG TABS Take 1 tablet by mouth daily.    [provider]  nitrofurantoin, macrocrystal-monohydrate, (MACROBID) 100 MG capsule Take 1 capsule (100 mg total) by mouth 2 (two) times daily. 08/18/18   Dua Mehler, Roselyn Bering, PA-C  phenazopyridine (PYRIDIUM) 200 MG tablet Take 1 tablet (200 mg total) by mouth 3 (three) times daily as needed for pain. 08/18/18   Faythe Ghee, PA-C    Allergies Patient has no known allergies.  Family History  Problem Relation Age of Onset  . Diabetes Mother        Type 2   . Hypertension Mother   . Arthritis Mother   . Hypertension Father   . Healthy Sister   . Hypertension Brother   . Diabetes Maternal Aunt   . Hypertension Maternal Aunt   . Healthy Maternal Uncle   . Arthritis Paternal Aunt   . Scoliosis Maternal Grandmother   . Hypertension Maternal Grandmother   . Hypertension Paternal Grandmother   . Hypertension Paternal Grandfather   . Healthy Sister   . Healthy Brother    . Diabetes Maternal Aunt   . Hypertension Maternal Aunt   . Diabetes Paternal Uncle     Social History Social History   Tobacco Use  . Smoking status: Never Smoker  . Smokeless tobacco: Never Used  Substance Use Topics  . Alcohol use: No  . Drug use: No    Review of Systems  Constitutional: No fever/chills Eyes: No visual changes. ENT: No sore throat. Respiratory: Denies cough Genitourinary: Positive for dysuria. Musculoskeletal: Negative for back pain. Skin: Negative for rash.    ____________________________________________   PHYSICAL EXAM:  VITAL SIGNS: ED Triage Vitals  Enc Vitals Group     BP 08/18/18 1620 (!) 169/81     Pulse Rate 08/18/18 1620 89     Resp --      Temp 08/18/18 1620 99.3 F (37.4 C)     Temp Source 08/18/18 1620 Oral     SpO2 --      Weight 08/18/18 1620 235 lb (106.6 kg)     Height 08/18/18 1620 5\' 4"  (1.626 m)     Head Circumference --      Peak Flow --      Pain Score 08/18/18 1624 8     Pain Loc --      Pain Edu? --      Excl. in GC? --     Constitutional: Alert and oriented.  Well appearing and in no acute distress. Eyes: Conjunctivae are normal.  Head: Atraumatic. Nose: No congestion/rhinnorhea. Mouth/Throat: Mucous membranes are moist.   Neck:  supple no lymphadenopathy noted Cardiovascular: Normal rate, regular rhythm. Heart sounds are normal Respiratory: Normal respiratory effort.  No retractions, lungs c t a  Abd: soft nontender bs normal all 4 quad, no CVA tenderness is noted GU: deferred Musculoskeletal: FROM all extremities, warm and well perfused Neurologic:  Normal speech and language.  Skin:  Skin is warm, dry and intact. No rash noted. Psychiatric: Mood and affect are normal. Speech and behavior are normal.  ____________________________________________   LABS (all labs ordered are listed, but only abnormal results are displayed)  Labs Reviewed  URINALYSIS, COMPLETE (UACMP) WITH MICROSCOPIC - Abnormal;  Notable for the following components:      Result Value   Color, Urine YELLOW (*)    APPearance CLOUDY (*)    Hgb urine dipstick LARGE (*)    Ketones, ur 20 (*)    Protein, ur 100 (*)    Leukocytes, UA LARGE (*)    RBC / HPF >50 (*)    WBC, UA >50 (*)    Non Squamous Epithelial PRESENT (*)    All other components within normal limits  URINE CULTURE  POC URINE PREG, ED  POCT PREGNANCY, URINE   ____________________________________________   ____________________________________________  RADIOLOGY    ____________________________________________   PROCEDURES  Procedure(s) performed: No  Procedures    ____________________________________________   INITIAL IMPRESSION / ASSESSMENT AND PLAN / ED COURSE  Pertinent labs & imaging results that were available during my care of the patient were reviewed by me and considered in my medical decision making (see chart for details).   Patient is a 22 year old female presents emergency department complaint of UTI symptoms.  On physical exam she appears well.  Exam is basically unremarkable.  POC urine pregnant, UA ordered    ----------------------------------------- 5:38 PM on 08/18/2018 -----------------------------------------  POC pregnant is negative.  UA is positive for large leuks, greater than 50 RBCs, greater than 50 WBCs  Splane the urinary results to the patient.  Explained to her that she has a UTI.  She is to follow-up with her regular doctor if not better in 3 days.  She was prescribed Macrobid and Pyridium.  A urine culture was ordered to ensure that she is on the right antibiotic.  She states she understands will comply with our treatment plan.  She was discharged in stable condition.  As part of my medical decision making, I reviewed the following data within the electronic MEDICAL RECORD NUMBER Nursing notes reviewed and incorporated, Labs reviewed UA positive for leuks, RBCs, WBCs, Old chart reviewed, Notes from  prior ED visits and [place] Controlled Substance Database  ____________________________________________   FINAL CLINICAL IMPRESSION(S) / ED DIAGNOSES  Final diagnoses:  Acute urinary tract infection      NEW MEDICATIONS STARTED DURING THIS VISIT:  New Prescriptions   NITROFURANTOIN, MACROCRYSTAL-MONOHYDRATE, (MACROBID) 100 MG CAPSULE    Take 1 capsule (100 mg total) by mouth 2 (two) times daily.   PHENAZOPYRIDINE (PYRIDIUM) 200 MG TABLET    Take 1 tablet (200 mg total) by mouth 3 (three) times daily as needed for pain.     Note:  This document was prepared using Dragon voice recognition software and may include unintentional dictation errors.    Faythe Ghee, PA-C 08/18/18 1740    Emily Filbert, MD 08/18/18 2127

## 2018-08-18 NOTE — Discharge Instructions (Addendum)
Follow-up with your regular doctor if not better in 3 days.  Return emergency department worsening.  I ordered a urine culture which will be back in 2 days.  The nurses will call you if your antibiotic needs to be changed.  Take the antibiotics as prescribed.  Drink plenty of water to flush her system.  Cranberry cocktail juice will also help.  There are also cranberry pills that you could take.

## 2018-08-20 LAB — URINE CULTURE: Culture: >=100000 — AB

## 2019-06-11 IMAGING — US US PELVIS COMPLETE
1 series · 13 of 25 positions shown · non-contrast
Comparison: None in PACs

CLINICAL DATA: Menorrhagia.  The patient is not sexually active.

EXAM:
TRANSABDOMINAL ULTRASOUND OF PELVIS
TECHNIQUE: Transabdominal ultrasound examination of the pelvis was performed
including evaluation of the uterus, ovaries, adnexal regions, and
pelvic cul-de-sac.

[Series 1: us pelvis complete · 0.26mm/px · 13 of 61 slices shown]
[im 1/61]
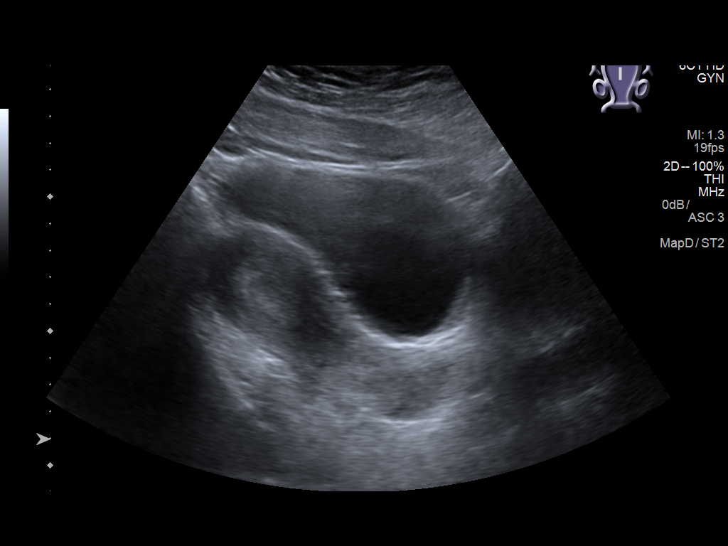
[im 6/61]
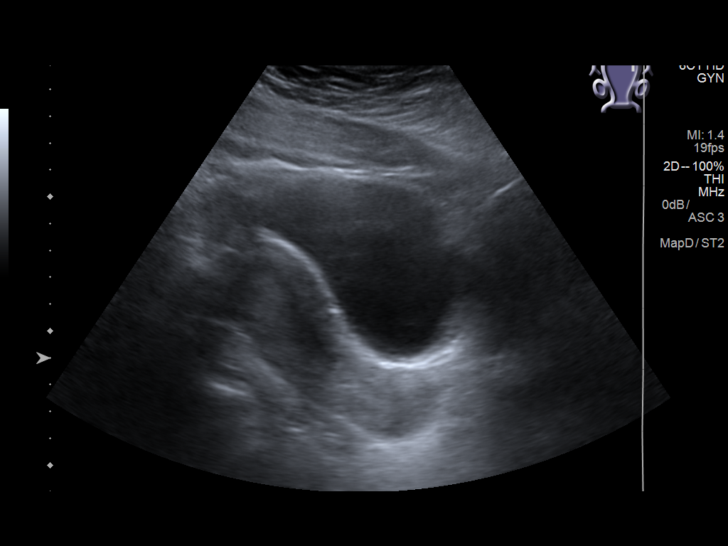
[im 11/61]
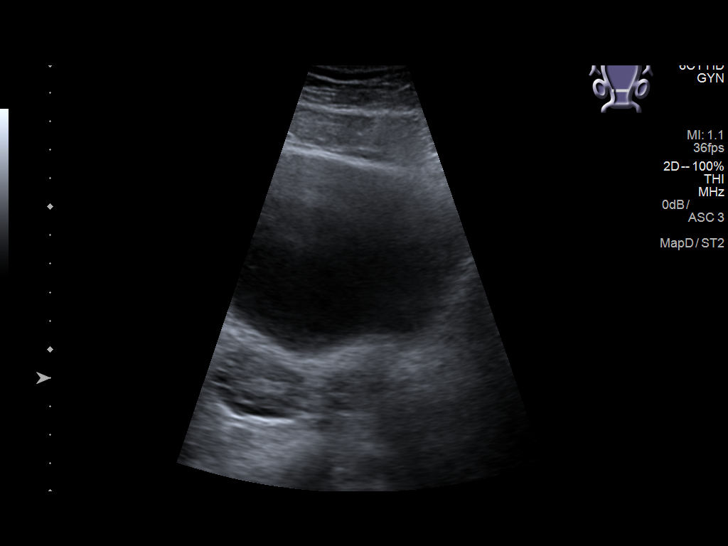
[im 16/61]
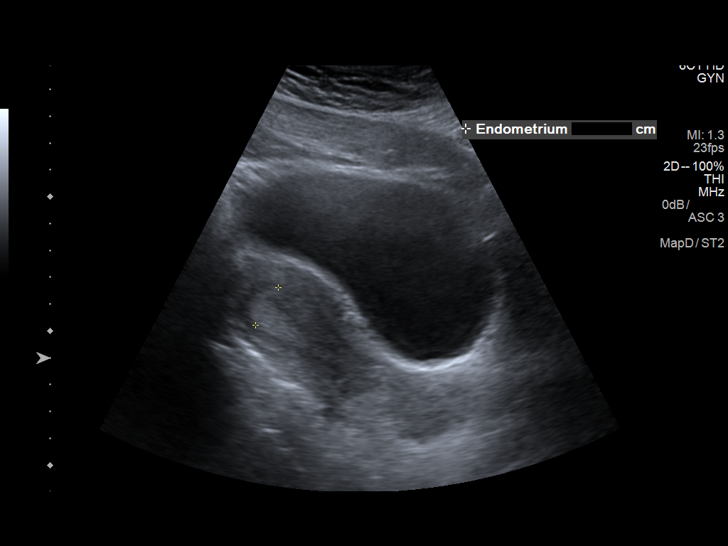
[im 21/61]
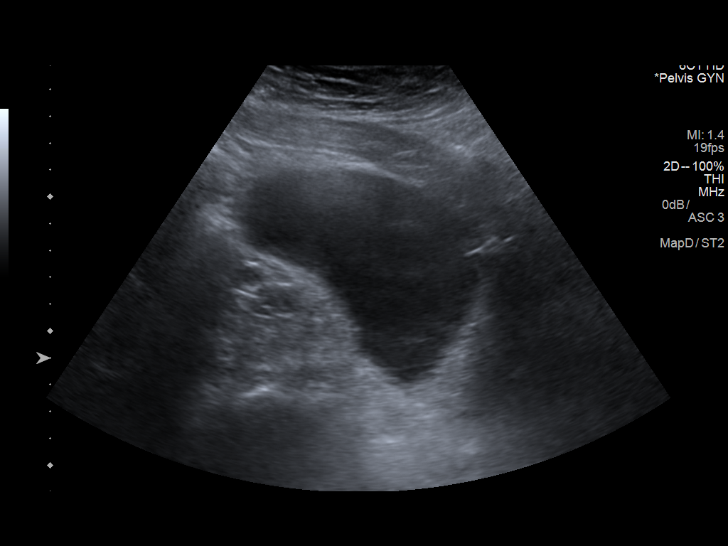
[im 26/61]
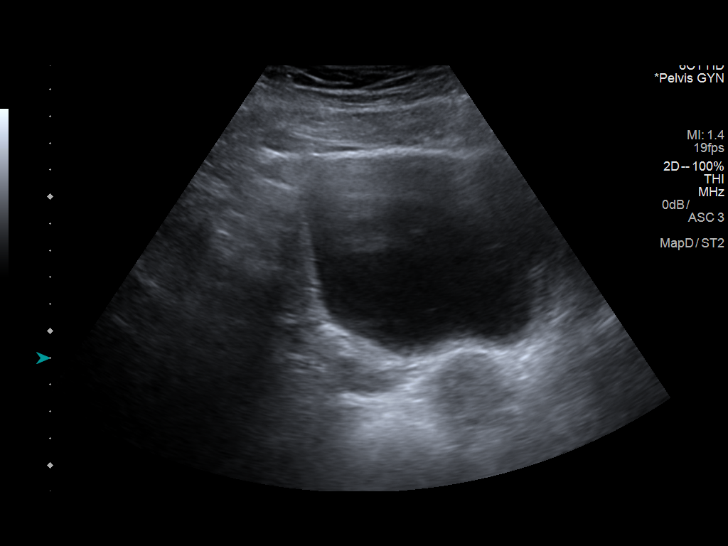
[im 31/61]
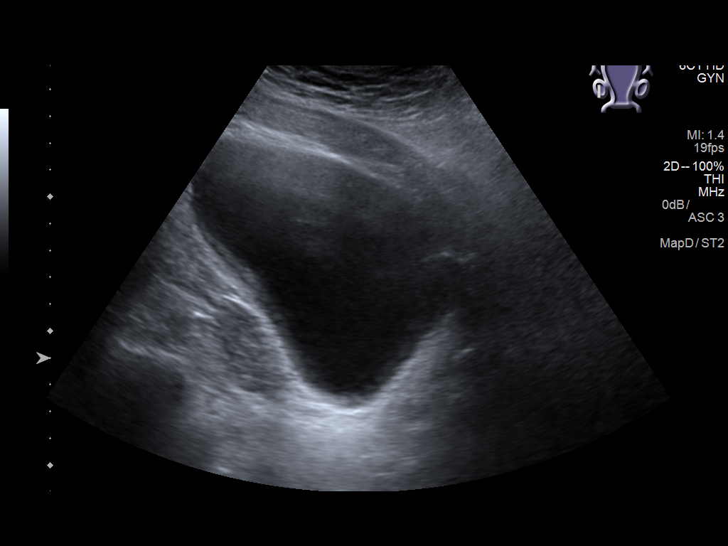
[im 36/61]
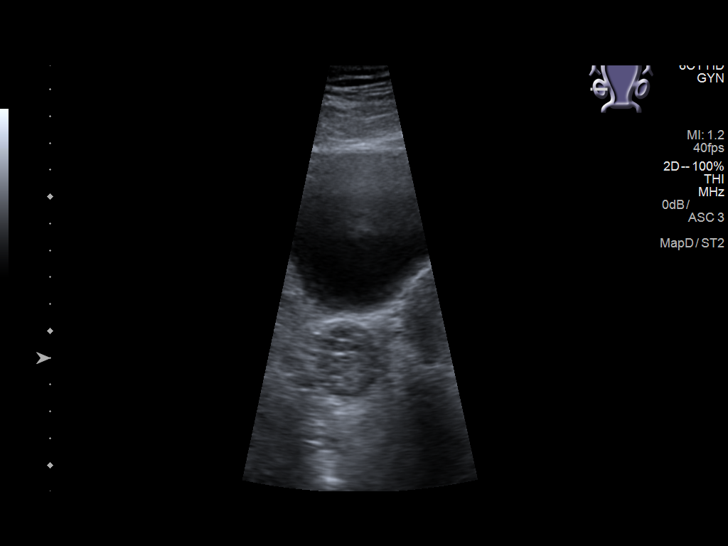
[im 41/61]
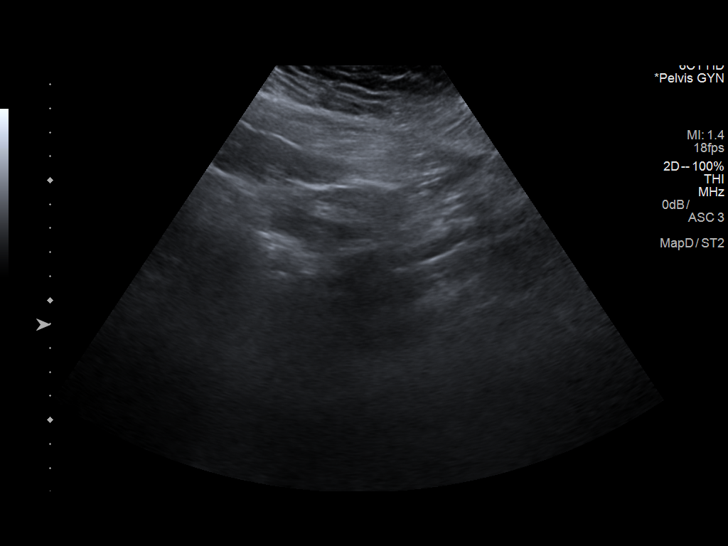
[im 46/61]
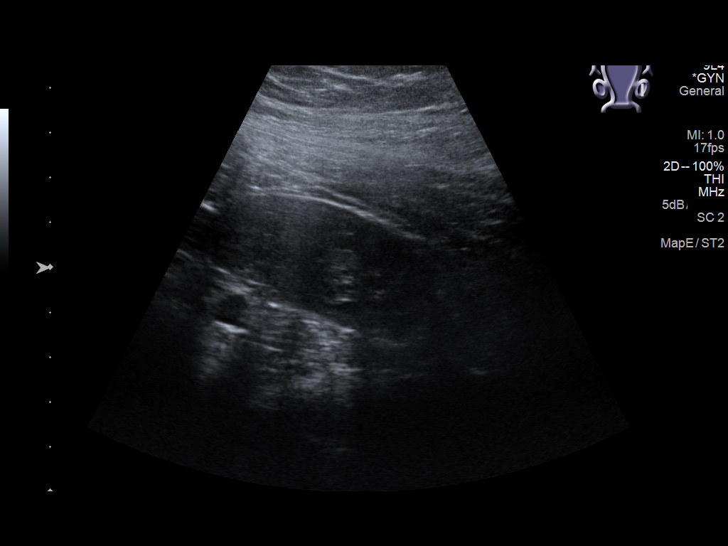
[im 51/61]
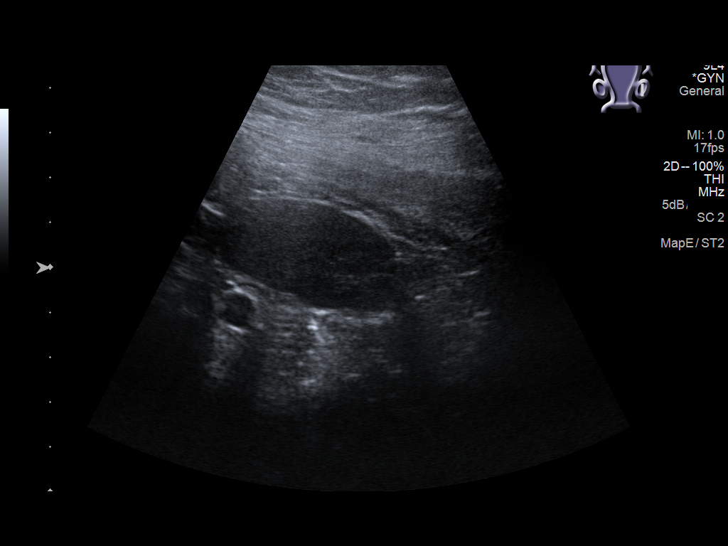
[im 56/61]
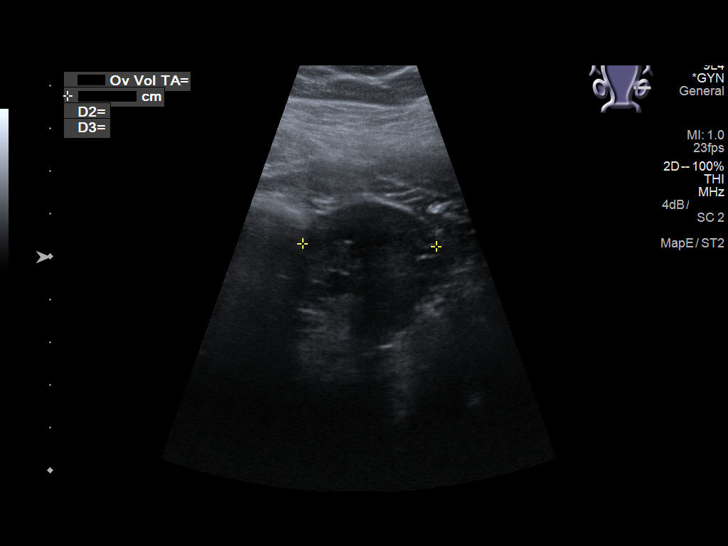
[im 61/61]
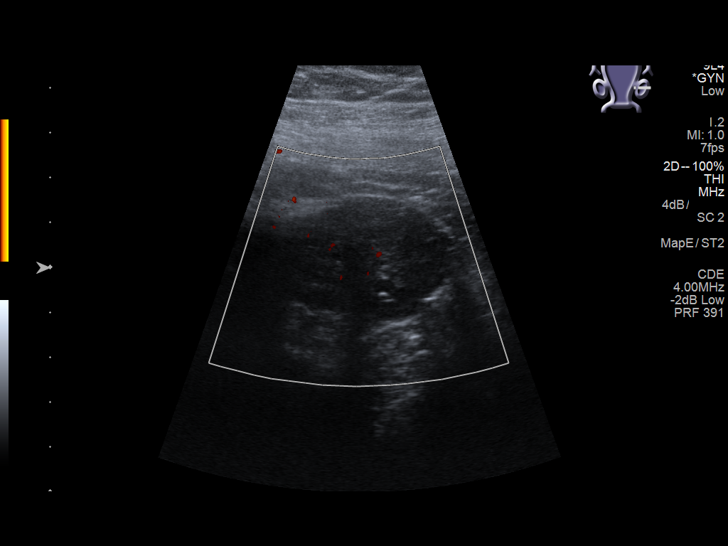

[13 of 25 positions shown; findings below may reference images not displayed]

FINDINGS: Uterus

Measurements: 9.9 x 3.6 x 4.8 cm. The myometrial echotexture is
normal.

Endometrium

Thickness: 16.4 mm.. No discrete endometrial masses or fluid
collections are observed.

Right ovary

Measurements: 4.9 x 2.3 x 3.0 cm. Normal appearance/no adnexal mass.

Left ovary

Measurements: 4.9 x 2.6 x 3.1 cm. Normal appearance/no adnexal mass.

Other findings:  No abnormal free fluid.
IMPRESSION: Thickened endometrium. The onset of the patient's most recent
menstrual period was June 06, 2017. No discrete endometrial mass
is observed. If bleeding remains unresponsive to hormonal or medical
therapy, focal lesion work-up with sonohysterogram should be
considered. Endometrial biopsy should also be considered in
pre-menopausal patients at high risk for endometrial carcinoma.
(Ref: Radiological Reasoning: Algorithmic Workup of Abnormal Vaginal
Bleeding with Endovaginal Sonography and Sonohysterography. AJR
3440; 191:S68-73)

Normal appearance of the ovaries and adnexal regions. No free pelvic
fluid.

## 2020-02-29 ENCOUNTER — Encounter: Payer: Self-pay | Admitting: Emergency Medicine

## 2020-02-29 ENCOUNTER — Other Ambulatory Visit: Payer: Self-pay

## 2020-02-29 ENCOUNTER — Emergency Department
Admission: EM | Admit: 2020-02-29 | Discharge: 2020-02-29 | Disposition: A | Discharge status: Home / Self Care | Payer: Medicaid Other | Attending: Emergency Medicine | Admitting: Emergency Medicine

## 2020-02-29 DIAGNOSIS — N939 Abnormal uterine and vaginal bleeding, unspecified: Secondary | ICD-10-CM | POA: Insufficient documentation

## 2020-02-29 DIAGNOSIS — R1110 Vomiting, unspecified: Secondary | ICD-10-CM | POA: Insufficient documentation

## 2020-02-29 DIAGNOSIS — Z79899 Other long term (current) drug therapy: Secondary | ICD-10-CM | POA: Insufficient documentation

## 2020-02-29 LAB — CBC WITH DIFFERENTIAL/PLATELET
Abs Immature Granulocytes: 0.02 10*3/uL (ref 0.00–0.07)
Basophils Absolute: 0 10*3/uL (ref 0.0–0.1)
Basophils Relative: 0 %
Eosinophils Absolute: 0.1 10*3/uL (ref 0.0–0.5)
Eosinophils Relative: 1 %
HCT: 33.9 % — ABNORMAL LOW (ref 36.0–46.0)
Hemoglobin: 11.3 g/dL — ABNORMAL LOW (ref 12.0–15.0)
Immature Granulocytes: 0 %
Lymphocytes Relative: 35 %
Lymphs Abs: 2.4 10*3/uL (ref 0.7–4.0)
MCH: 29 pg (ref 26.0–34.0)
MCHC: 33.3 g/dL (ref 30.0–36.0)
MCV: 87.1 fL (ref 80.0–100.0)
Monocytes Absolute: 0.4 10*3/uL (ref 0.1–1.0)
Monocytes Relative: 7 %
Neutro Abs: 3.9 10*3/uL (ref 1.7–7.7)
Neutrophils Relative %: 57 %
Platelets: 291 10*3/uL (ref 150–400)
RBC: 3.89 MIL/uL (ref 3.87–5.11)
RDW: 14.4 % (ref 11.5–15.5)
WBC: 6.8 10*3/uL (ref 4.0–10.5)
nRBC: 0 % (ref 0.0–0.2)

## 2020-02-29 LAB — BASIC METABOLIC PANEL
Anion gap: 7 (ref 5–15)
BUN: 11 mg/dL (ref 6–20)
CO2: 25 mmol/L (ref 22–32)
Calcium: 8.7 mg/dL — ABNORMAL LOW (ref 8.9–10.3)
Chloride: 106 mmol/L (ref 98–111)
Creatinine, Ser: 0.59 mg/dL (ref 0.44–1.00)
GFR calc Af Amer: >60 mL/min (ref >60)
GFR calc non Af Amer: >60 mL/min (ref >60)
Glucose, Bld: 102 mg/dL — ABNORMAL HIGH (ref 70–99)
Potassium: 3.7 mmol/L (ref 3.5–5.1)
Sodium: 138 mmol/L (ref 135–145)

## 2020-02-29 LAB — HCG, QUANTITATIVE, PREGNANCY: hCG, Beta Chain, Quant, S: <1 m[IU]/mL (ref <5)

## 2020-02-29 MED ORDER — METHYLERGONOVINE MALEATE 0.2 MG/ML IJ SOLN
0.2000 mg | Freq: Once | INTRAMUSCULAR | Status: AC
  Administered 2020-02-29: 0.2 mg via INTRAMUSCULAR
  Filled 2020-02-29: qty 1

## 2020-02-29 NOTE — ED Notes (Signed)
Pt came out to desk seemingly confused stating " I don't feel good I don't know what's wrong", just before pt vomited in the sink.  Pt st after vomiting in the sink that she feels a lot better but is uncertain as to what caused her emesis.   MD made aware

## 2020-02-29 NOTE — ED Provider Notes (Signed)
[facility] Emergency Department Provider Note  Time seen: 6:45 PM  I have reviewed the triage vital signs and the nursing notes.   HISTORY  Chief Complaint Vaginal Bleeding   HPI Kara Bell is a 24 y.o. female with a past medical history of anemia, DU B, presents to the emergency department for menstrual bleeding.  According to the patient for the past 2 months she has been experiencing vaginal bleeding.  Patient has been taking OCPs without relief.  Patient states approximately 2 years ago she had similar symptoms requiring blood transfusions although states she does not feel weak or fatigued like she did back then.  Patient was concerned because the OCPs were not helping so she came to the emergency department for evaluation.  Overall the patient appears well, no distress.  Patient denies any abdominal pain.  Patient sees Dr. Leonides Schanz of OB/GYN but has not contacted her.   Past Medical History:  Diagnosis Date  . Anemia     Patient Active Problem List   Diagnosis Date Noted  . Iron deficiency anemia due to chronic blood loss 06/10/2017    History reviewed. No pertinent surgical history.  Prior to Admission medications   Medication Sig Start Date End Date Taking? Authorizing Provider  Ferrous Sulfate (IRON) 325 (65 Fe) MG TABS Take 1 tablet by mouth daily.    [provider]  nitrofurantoin, macrocrystal-monohydrate, (MACROBID) 100 MG capsule Take 1 capsule (100 mg total) by mouth 2 (two) times daily. 08/18/18   Fisher, Linden Dolin, PA-C  phenazopyridine (PYRIDIUM) 200 MG tablet Take 1 tablet (200 mg total) by mouth 3 (three) times daily as needed for pain. 08/18/18   Versie Starks, PA-C    No Known Allergies  Family History  Problem Relation Age of Onset  . Diabetes Mother        Type 2   . Hypertension Mother   . Arthritis Mother   . Hypertension Father   . Healthy Sister   . Hypertension Brother   . Diabetes Maternal Aunt   . Hypertension  Maternal Aunt   . Healthy Maternal Uncle   . Arthritis Paternal Aunt   . Scoliosis Maternal Grandmother   . Hypertension Maternal Grandmother   . Hypertension Paternal Grandmother   . Hypertension Paternal Grandfather   . Healthy Sister   . Healthy Brother   . Diabetes Maternal Aunt   . Hypertension Maternal Aunt   . Diabetes Paternal Uncle     Social History Social History   Tobacco Use  . Smoking status: Never Smoker  . Smokeless tobacco: Never Used  Substance Use Topics  . Alcohol use: No  . Drug use: No    Review of Systems Constitutional: Negative for fever Cardiovascular: Negative for chest pain. Respiratory: Negative for shortness of breath. Gastrointestinal: Negative for abdominal pain Genitourinary: Positive for vaginal bleeding x2 months. Musculoskeletal: Negative for musculoskeletal complaints Neurological: Negative for headache All other ROS negative  ____________________________________________   PHYSICAL EXAM:  VITAL SIGNS: ED Triage Vitals  Enc Vitals Group     BP 02/29/20 1603 139/79     Pulse Rate 02/29/20 1603 62     Resp 02/29/20 1603 16     Temp 02/29/20 1603 97.8 F (36.6 C)     Temp Source 02/29/20 1603 Oral     SpO2 02/29/20 1603 98 %     Weight 02/29/20 1600 235 lb 0.2 oz (106.6 kg)     Height 02/29/20 1600 5\' 4"  (1.626  m)     Head Circumference --      Peak Flow --      Pain Score 02/29/20 1600 0     Pain Loc --      Pain Edu? --      Excl. in GC? --     Constitutional: Alert and oriented. Well appearing and in no distress. Eyes: Normal exam ENT      Head: Normocephalic and atraumatic.      Mouth/Throat: Mucous membranes are moist. Cardiovascular: Normal rate, regular rhythm. No murmur Respiratory: Normal respiratory effort without tachypnea nor retractions. Breath sounds are clear  Gastrointestinal: Soft and nontender. No distention.  Musculoskeletal: Nontender with normal range of motion in all extremities. Neurologic:   Normal speech and language. No gross focal neurologic deficits  Skin:  Skin is warm, dry and intact.  Psychiatric: Mood and affect are normal.  ____________________________________________   INITIAL IMPRESSION / ASSESSMENT AND PLAN / ED COURSE  Pertinent labs & imaging results that were available during my care of the patient were reviewed by me and considered in my medical decision making (see chart for details).   Patient presents to the emergency department for evaluation of continuing vaginal bleeding over the past 2 months.  Overall the patient appears well, no acute distress, reassuring physical exam.  No abdominal tenderness on exam.  Patient's lab work is overall reassuring with a hemoglobin of 11.3.  However as the patient has tried OCPs at home without effect and due to her history of severe anemia due to a similar situation requiring admission to the hospital blood transfusion in the past we will use Methergine 0.2 mg intramuscular injection in the emergency department.  We will monitor for approximately 30 minutes afterwards.  As long as patient continues to appear well we will discharge with OB/GYN follow-up.  Patient agreeable to plan of care.  Patient has received her injection without issue.  We will discharge patient home.  Kara Bell was evaluated in Emergency Department on 02/29/2020 for the symptoms described in the history of present illness. She was evaluated in the context of the global COVID-19 pandemic, which necessitated consideration that the patient might be at risk for infection with the SARS-CoV-2 virus that causes COVID-19. Institutional protocols and algorithms that pertain to the evaluation of patients at risk for COVID-19 are in a state of rapid change based on information released by regulatory bodies including the CDC and federal and state organizations. These policies and algorithms were followed during the patient's care in the  ED.  ____________________________________________   FINAL CLINICAL IMPRESSION(S) / ED DIAGNOSES  Dysfunctional uterine bleeding   Minna Antis, MD 02/29/20 2006

## 2020-02-29 NOTE — ED Notes (Signed)
EDP reassessed pt before d/c pt st she feels good enough to go home

## 2020-02-29 NOTE — ED Triage Notes (Signed)
C/O menstrual period for the past 2 months.  Two years ago due to anemia.  States not feeling fatigued or similar to two years ago, just wants to check blood levels.

## 2020-03-09 ENCOUNTER — Encounter: Payer: Self-pay | Admitting: Obstetrics and Gynecology

## 2020-03-09 ENCOUNTER — Other Ambulatory Visit: Payer: Self-pay

## 2020-03-09 ENCOUNTER — Ambulatory Visit (INDEPENDENT_AMBULATORY_CARE_PROVIDER_SITE_OTHER): Payer: Medicaid Other | Admitting: Obstetrics and Gynecology

## 2020-03-09 VITALS — BP 151/97 | HR 79 | Ht 64.0 in | Wt 213.1 lb

## 2020-03-09 DIAGNOSIS — Z30011 Encounter for initial prescription of contraceptive pills: Secondary | ICD-10-CM | POA: Diagnosis not present

## 2020-03-09 DIAGNOSIS — N938 Other specified abnormal uterine and vaginal bleeding: Secondary | ICD-10-CM

## 2020-03-09 MED ORDER — NORGESTIMATE-ETH ESTRADIOL 0.25-35 MG-MCG PO TABS: 1.0000 | ORAL_TABLET | Freq: Every day | ORAL | 2 refills | Status: AC

## 2020-03-09 NOTE — Progress Notes (Signed)
HPI:      Ms. Kara Bell is a 24 y.o. No obstetric history on file. who LMP was No LMP recorded (lmp unknown).  Subjective:   She presents today for emergency department follow-up.  She was seen in the emergency department for having several weeks of vaginal bleeding.  Since her emergency department visit she has begun OCPs and she states her bleeding has decreased. (She is 2 days into her brand-new pack) Patient is a relatively poor historian when it comes to times and dates but states that the only time her.  Was regular over the last several years was when she was taking OCPs.  When she stopped OCPs she went several months without bleeding. She does have a remote history of having such heavy vaginal bleeding that she was admitted to the hospital and required transfusion.  This was several years ago.  She says an ultrasound at that time showed no abnormalities.    Hx: The following portions of the patient's history were reviewed and updated as appropriate:             She  has a past medical history of Anemia. She does not have any pertinent problems on file. She  has no past surgical history on file. Her family history includes Arthritis in her mother and paternal aunt; Diabetes in her maternal aunt, maternal aunt, mother, and paternal uncle; Healthy in her brother, maternal uncle, sister, and sister; Hypertension in her brother, father, maternal aunt, maternal aunt, maternal grandmother, mother, paternal grandfather, and paternal grandmother; Scoliosis in her maternal grandmother. She  reports that she has never smoked. She has never used smokeless tobacco. She reports that she does not drink alcohol or use drugs. She has a current medication list which includes the following prescription(s): norethindrone acetate-ethinyl estrad-fe, iron, and norgestimate-ethinyl estradiol. She has No Known Allergies.       Review of Systems:  Review of Systems  Constitutional: Denied constitutional  symptoms, night sweats, recent illness, fatigue, fever, insomnia and weight loss.  Eyes: Denied eye symptoms, eye pain, photophobia, vision change and visual disturbance.  Ears/Nose/Throat/Neck: Denied ear, nose, throat or neck symptoms, hearing loss, nasal discharge, sinus congestion and sore throat.  Cardiovascular: Denied cardiovascular symptoms, arrhythmia, chest pain/pressure, edema, exercise intolerance, orthopnea and palpitations.  Respiratory: Denied pulmonary symptoms, asthma, pleuritic pain, productive sputum, cough, dyspnea and wheezing.  Gastrointestinal: Denied, gastro-esophageal reflux, melena, nausea and vomiting.  Genitourinary: See HPI for additional information.  Musculoskeletal: Denied musculoskeletal symptoms, stiffness, swelling, muscle weakness and myalgia.  Dermatologic: Denied dermatology symptoms, rash and scar.  Neurologic: Denied neurology symptoms, dizziness, headache, neck pain and syncope.  Psychiatric: Denied psychiatric symptoms, anxiety and depression.  Endocrine: Denied endocrine symptoms including hot flashes and night sweats.   Meds:   Current Outpatient Medications on File Prior to Visit  Medication Sig Dispense Refill  . Norethindrone Acetate-Ethinyl Estrad-FE (LOESTRIN 24 FE) 1-20 MG-MCG(24) tablet Take by mouth.    . Ferrous Sulfate (IRON) 325 (65 Fe) MG TABS Take 1 tablet by mouth daily.     No current facility-administered medications on file prior to visit.    Objective:     Vitals:   03/09/20 0732  BP: (!) 151/97  Pulse: 79                Assessment:    No obstetric history on file. Patient Active Problem List   Diagnosis Date Noted  . Iron deficiency anemia due to chronic blood loss 06/10/2017  1. Dysfunctional uterine bleeding     Patient likely anovulatory based on history.  She has now started a pack of OCPs which " I had at home" and her bleeding has slowed.   Plan:            1.  We have discussed dysfunctional  bleeding in detail.  The anovulatory nature of her bleeding has been discussed.  Because she has started OCPs I think it is reasonable to continue to cycle her on them without further work-up at this time.  In the future when fertility becomes an issue if her cycles remain irregular would consider a work-up for PCO.  We have discussed this. Orders No orders of the defined types were placed in this encounter.    Meds ordered this encounter  Medications  . norgestimate-ethinyl estradiol (ORTHO-CYCLEN) 0.25-35 MG-MCG tablet    Sig: Take 1 tablet by mouth daily.    Dispense:  3 Package    Refill:  2      F/U  Return for Annual Physical. I spent 32 minutes involved in the care of this patient preparing to see the patient by obtaining and reviewing her medical history (including labs, imaging tests and prior procedures), documenting clinical information in the electronic health record (EHR), counseling and coordinating care plans, writing and sending prescriptions, ordering tests or procedures and directly communicating with the patient by discussing pertinent items from her history and physical exam as well as detailing my assessment and plan as noted above so that she has an informed understanding.  All of her questions were answered.  Finis Bud, M.D. 03/09/2020 8:18 AM

## 2020-10-28 ENCOUNTER — Emergency Department
Admission: EM | Admit: 2020-10-28 | Discharge: 2020-10-28 | Disposition: A | Discharge status: Other Acute Inpatient Hospital | Payer: Medicaid Other

## 2022-08-02 ENCOUNTER — Encounter: Payer: Self-pay | Admitting: Oncology

## 2022-08-02 ENCOUNTER — Emergency Department: Payer: Self-pay

## 2022-08-02 ENCOUNTER — Emergency Department
Admission: EM | Admit: 2022-08-02 | Discharge: 2022-08-02 | Disposition: A | Discharge status: Home / Self Care | Payer: Self-pay | Attending: Emergency Medicine | Admitting: Emergency Medicine

## 2022-08-02 ENCOUNTER — Other Ambulatory Visit: Payer: Self-pay

## 2022-08-02 DIAGNOSIS — Z1152 Encounter for screening for COVID-19: Secondary | ICD-10-CM | POA: Insufficient documentation

## 2022-08-02 DIAGNOSIS — F410 Panic disorder [episodic paroxysmal anxiety] without agoraphobia: Secondary | ICD-10-CM | POA: Insufficient documentation

## 2022-08-02 LAB — CBG MONITORING, ED: Glucose-Capillary: 112 mg/dL — ABNORMAL HIGH (ref 70–99)

## 2022-08-02 LAB — SARS CORONAVIRUS 2 BY RT PCR: SARS Coronavirus 2 by RT PCR: NEGATIVE

## 2022-08-02 NOTE — ED Provider Notes (Signed)
[facility] Provider Note    Event Date/Time   First MD Initiated Contact with Patient 08/02/22 2217     (approximate)   History   URI   HPI  Kara Bell is a 26 y.o. female with history of anemia presents emergency department with complaints of cough, some shortness of breath earlier today, states that everything started to go black her fingers covid and, her lips became numb, and she had some difficulty breathing.  States sat down [employer] to get a little better.  States symptoms lasted for 45 minutes to an hour.  Family member states that he has never seen her that way in the she was hyperventilating.  Patient states she feels much better now.      Physical Exam   Triage Vital Signs: ED Triage Vitals  Enc Vitals Group     BP 08/02/22 2045 (!) 122/94     Pulse Rate 08/02/22 2114 87     Resp 08/02/22 2114 (!) 24     Temp 08/02/22 2043 97.6 F (36.4 C)     Temp Source 08/02/22 2043 Oral     SpO2 08/02/22 2045 93 %     Weight --      Height --      Head Circumference --      Peak Flow --      Pain Score --      Pain Loc --      Pain Edu? --      Excl. in GC? --     Most recent vital signs: Vitals:   08/02/22 2045 08/02/22 2114  BP: (!) 122/94   Pulse:  87  Resp:  (!) 24  Temp:    SpO2: 93%      General: Awake, no distress.   CV:  Good peripheral perfusion. regular rate and  rhythm Resp:  Normal effort. Lungs CTA, cough is congested Abd:  No distention.   Other:      ED Results / Procedures / Treatments   Labs (all labs ordered are listed, but only abnormal results are displayed) Labs Reviewed  CBG MONITORING, ED - Abnormal; Notable for the following components:      Result Value   Glucose-Capillary 112 (*)    All other components within normal limits  SARS CORONAVIRUS 2 BY RT PCR  POC URINE PREG, ED     EKG     RADIOLOGY Chest x-ray    PROCEDURES:   Procedures   MEDICATIONS ORDERED IN ED: Medications  - No data to display   IMPRESSION / MDM / ASSESSMENT AND PLAN / ED COURSE  I reviewed the triage vital signs and the nursing notes.                              Differential diagnosis includes, but is not limited to, COVID, CAP, hyperglycemia, pregnancy, panic attack  Patient's presentation is most consistent with acute complicated illness / injury requiring diagnostic workup.   Chest x-ray independently reviewed and interpreted by me as being negative, COVID test negative   POC pregnancy is negative, CBG is 112  I did explain these findings to the patient. She had an anxiety attack.  She was given information on anxiety.  Follow-up with her regular doctor if not improving 3 days.  Return if worsening.  Given work note discharge tenderness.  FINAL CLINICAL IMPRESSION(S) / ED DIAGNOSES   Final diagnoses:  Panic attack     Rx / DC Orders   ED Discharge Orders     None        Note:  This document was prepared using Dragon voice recognition software and may include unintentional dictation errors.    Versie Starks, PA-C 08/02/22 2305    Harvest Dark, MD 08/02/22 2316

## 2022-08-02 NOTE — ED Notes (Signed)
Poct pregnancy Negative  poct fsbs 112.

## 2022-08-02 NOTE — Discharge Instructions (Signed)
Follow-up with your regular doctor.  If your symptoms are worsening please return emergency department.

## 2022-08-02 NOTE — ED Triage Notes (Signed)
Pt states she "had a little cold" and was at work today and started feeling SHOB with chills.  Pt is hyperventilating in triage.  No pain other than hands cramping with hyperventilation.

## 2024-12-29 ENCOUNTER — Ambulatory Visit: Payer: Self-pay | Admitting: Family Medicine
